# Patient Record
Sex: Male | Born: 1970 | Hispanic: No | Smoking: Former smoker
Health system: Southern US, Community
[De-identification: ages and names within clinical notes are randomized; demographics above are authoritative.]

## PROBLEM LIST (undated history)

## (undated) DIAGNOSIS — IMO0002 Reserved for concepts with insufficient information to code with codable children: Secondary | ICD-10-CM

## (undated) DIAGNOSIS — E785 Hyperlipidemia, unspecified: Secondary | ICD-10-CM

## (undated) DIAGNOSIS — I493 Ventricular premature depolarization: Secondary | ICD-10-CM

## (undated) DIAGNOSIS — I4891 Unspecified atrial fibrillation: Secondary | ICD-10-CM

## (undated) DIAGNOSIS — I499 Cardiac arrhythmia, unspecified: Secondary | ICD-10-CM

## (undated) HISTORY — DX: Ventricular premature depolarization: I49.3

## (undated) HISTORY — DX: Hyperlipidemia, unspecified: E78.5

## (undated) HISTORY — DX: Reserved for concepts with insufficient information to code with codable children: IMO0002

## (undated) HISTORY — PX: OTHER SURGICAL HISTORY: SHX169

## (undated) HISTORY — DX: Unspecified atrial fibrillation: I48.91

---

## 2010-04-26 ENCOUNTER — Ambulatory Visit: Payer: Self-pay | Admitting: Cardiovascular Disease

## 2011-10-02 ENCOUNTER — Encounter: Payer: Self-pay | Admitting: *Deleted

## 2012-03-26 ENCOUNTER — Ambulatory Visit (INDEPENDENT_AMBULATORY_CARE_PROVIDER_SITE_OTHER): Payer: 59 | Admitting: Internal Medicine

## 2012-03-26 ENCOUNTER — Encounter: Payer: Self-pay | Admitting: Internal Medicine

## 2012-03-26 VITALS — BP 114/80 | HR 79 | Temp 98.3°F | Ht 72.0 in | Wt 200.4 lb

## 2012-03-26 DIAGNOSIS — R0789 Other chest pain: Secondary | ICD-10-CM

## 2012-03-26 DIAGNOSIS — R05 Cough: Secondary | ICD-10-CM

## 2012-03-26 DIAGNOSIS — R059 Cough, unspecified: Secondary | ICD-10-CM

## 2012-03-26 NOTE — Patient Instructions (Addendum)
Classic subdiaphragmatic pain pattern suggests ibs:  Stereotypical, migratory with a very limited distribution of pain locations, daytime, not exacerbated by ex or coughing, worse in sitting position, associated with generalized abd bloating, not present supine due to the dome effect of the diaphragm is  canceled in that position. Frequently these patients have had multiple negative GI workups and CT scans and cardiology.  Treatment consists of avoiding foods that cause gas (especially beans and raw vegetables like spinach and salads)  and citrucel 1 heaping tsp twice daily with a large glass of water.  Pain should improve w/in 2 weeks and if not return to clinic  Zantac 150 x 2 after breakfast and after supper.   GERD (REFLUX)  is an extremely common cause of respiratory symptoms, many times with no significant heartburn at all.    It can be treated with medication, but also with lifestyle changes including avoidance of late meals, excessive alcohol, smoking cessation, and avoid fatty foods, chocolate, peppermint, colas, red wine, and acidic juices such as orange juice.  NO MINT OR MENTHOL PRODUCTS SO NO COUGH DROPS  USE SUGARLESS CANDY INSTEAD (jolley ranchers or Stover's)  NO OIL BASED VITAMINS - use powdered substitutes.   For back discomfort you can try zostrix cream applied 4 x daily especially at bedtime.   Late add  Chart review shows no xrays in system > stronlgy rec he return for f/u and cxr one month

## 2012-03-26 NOTE — Progress Notes (Signed)
  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  03/26/2012 1st pulmonary eval cc indolent onset persistent daily cp x sev years back > front not pleuritic pressure sensation seems better p eat, related p pass gas more up than down . Presently no sob. Stress makes it worse. Positional better supine, not pleuritic.  Cough is daily, dry, taking zantac as needed, never prilosec. No overt hb or sinu complaints or seasonal variation, and really dates back to smoking (never got completely better when quit)  Area of post cw discomfort hyperesthetic to touch.  Sleeping ok without nocturnal  or early am exacerbation  of respiratory  c/o's or need for noct saba. Also denies any obvious fluctuation of symptoms with weather or environmental changes or other aggravating or alleviating factors except as outlined above   Review of Systems  Constitutional: Negative for fever, chills, activity change, appetite change and unexpected weight change.  HENT: Negative for congestion, sore throat, rhinorrhea, sneezing, trouble swallowing, dental problem, voice change and postnasal drip.   Eyes: Negative for visual disturbance.  Respiratory: Positive for cough and shortness of breath. Negative for choking.   Cardiovascular: Negative for chest pain and leg swelling.  Gastrointestinal: Negative for nausea, vomiting and abdominal pain.  Genitourinary: Negative for difficulty urinating.  Musculoskeletal: Negative for arthralgias.  Skin: Negative for rash.  Psychiatric/Behavioral: Negative for behavioral problems and confusion.       Objective:   Physical Exam  Wt 200 lb 03/26/2012   In general amb pleasant wm nad ' HEENT:  nl dentition, turbinates, and orophanx. Nl external ear canals without cough reflex   NECK :  without JVD/Nodes/TM/ nl carotid upstrokes bilaterally   LUNGS: no acc muscle use, clear to A and P bilaterally without cough on insp or exp maneuvers   CV:  RRR  no s3 or murmur or increase in P2, no edema   ABD:  soft and nontender with nl excursion in the supine position. No bruits or organomegaly, bowel sounds nl  MS:  warm without deformities, calf tenderness, cyanosis or clubbing  SKIN: warm and dry without lesions    NEURO:  alert, approp, no deficits       Assessment & Plan:

## 2012-03-27 ENCOUNTER — Institutional Professional Consult (permissible substitution): Payer: Self-pay | Admitting: Internal Medicine

## 2012-03-28 ENCOUNTER — Encounter: Payer: Self-pay | Admitting: Cardiovascular Disease

## 2012-03-28 ENCOUNTER — Ambulatory Visit (INDEPENDENT_AMBULATORY_CARE_PROVIDER_SITE_OTHER): Payer: 59 | Admitting: Cardiovascular Disease

## 2012-03-28 VITALS — BP 132/80 | Ht 72.0 in | Wt 203.0 lb

## 2012-03-28 DIAGNOSIS — R0789 Other chest pain: Secondary | ICD-10-CM

## 2012-03-28 DIAGNOSIS — R9431 Abnormal electrocardiogram [ECG] [EKG]: Secondary | ICD-10-CM

## 2012-03-28 NOTE — Progress Notes (Signed)
    Edwin Whitaker Date of Birth  April 24, 1971       [facility]    [facility] [address], [address]  [address], [address] [place], [place]  [address]   [place], [place]  [address] [phone]     [phone]   Fax  [phone]    Fax [phone]  Problem List: 1.  Chest pain 2. Hyperlipidemia  History of Present Illness:  Edwin Whitaker is 41 yo with hx of chest pain.  He has had a negative stress.  He has also had some palpitations and was given propranolol.   Remote hx of smoking.  He has had lots of chest discomfort recently.  He has had some chest pain  - radiating to his back.  It is exacerbated by mental  stress .  Walking / exercising also seems to exacerbate it.These episodes are episodic.    He still has palpitations.    He has seen GI - nagative endoscopy.  Saw Dr. Sherene Sires - Dr. Sherene Sires thinks this is gas pain.  He has been a good diet of citrocil and zantac and feels a bit better.    Current Outpatient Prescriptions on File Prior to Visit  Medication Sig Dispense Refill  . aspirin 81 MG tablet Take 81 mg by mouth daily.      Marland Kitchen atorvastatin (LIPITOR) 20 MG tablet Take 20 mg by mouth daily.      . propranolol (INDERAL) 10 MG tablet Take 10 mg by mouth as needed.      . valACYclovir (VALTREX) 1000 MG tablet Take 1,000 mg by mouth daily.        No Known Allergies  Past Medical History  Diagnosis Date  . HLD (hyperlipidemia)   . Transient atrial fibrillation or flutter   . PVC's (premature ventricular contractions)     Past Surgical History  Procedure Date  . None     History  Smoking status  . Former Smoker -- 1.5 packs/day for 23 years  . Types: Cigarettes  . Quit date: 10/08/2009  Smokeless tobacco  . Never Used    History  Alcohol Use  . Yes    Comment: 10 drinks/month    Family History  Problem Relation Age of Onset  . Allergies Father   . Breast cancer Sister     Reviw of Systems:  Reviewed in the HPI.  All other  systems are negative.  Physical Exam: Blood pressure 132/80, height 6' (1.829 m), weight 203 lb (92.08 kg), SpO2 95.00%. General: Well developed, well nourished, in no acute distress.  Head: Normocephalic, atraumatic, sclera non-icteric, mucus membranes are moist,   Neck: Supple. Carotids are 2 + without bruits. No JVD  Lungs: Clear bilaterally to auscultation.  Heart: regular rate.  normal  S1 S2. No murmurs, gallops or rubs.  Abdomen: Soft, non-tender, non-distended with normal bowel sounds. No hepatomegaly. No rebound/guarding. No masses.  Msk:  Strength and tone are normal  Extremities: No clubbing or cyanosis. No edema.  Distal pedal pulses are 2+ and equal bilaterally.  Neuro: Alert and oriented X 3. Moves all extremities spontaneously.  Psych:  Responds to questions appropriately with a normal affect.  ECG: 03/28/2012-normal sinus rhythm at 83. He has no ST or T wave changes. A small inferior Q waves. These Q waves are slightly bigger than they were several years ago.  Assessment / Plan:

## 2012-03-28 NOTE — Assessment & Plan Note (Addendum)
Edwin Whitaker presents with some episodes of chest discomfort. These are somewhat atypical but he does state that occasionally will worsen with exercise. It is described as a tightness or an aching sensation.  He has a very tiny Q waves on his resting EKG. These are little bit more prominent now compared to his previous EKG several years ago.  I would like to perform a stress echocardiogram. This should give [place] a good look at his wall motion. We should be able to easily rule out a previous inferior wall myocardial infarction with the stress echo.  I will see him again in one year. I'll see him sooner if he has any abnormalities seen on stress echo.

## 2012-03-28 NOTE — Patient Instructions (Signed)
Your physician has requested that you have a stress echocardiogram.  Please follow instruction sheet as given.  Your physician wants you to follow-up in: 1 year  You will receive a reminder letter in the mail two months in advance. If you don't receive a letter, please call our office to schedule the follow-up appointment.  Your physician recommends that you continue on your current medications as directed. Please refer to the Current Medication list given to you today.

## 2012-04-03 ENCOUNTER — Encounter: Payer: Self-pay | Admitting: Internal Medicine

## 2012-04-03 DIAGNOSIS — R05 Cough: Secondary | ICD-10-CM | POA: Insufficient documentation

## 2012-04-03 NOTE — Assessment & Plan Note (Signed)
Classic subdiaphragmatic pain pattern suggests ibs:  Stereotypical, migratory with a very limited distribution of pain locations, daytime, not exacerbated by ex or coughing, worse in sitting position, associated with generalized abd bloating, not present supine due to the dome effect of the diaphragm is  canceled in that position. Frequently these patients have had multiple negative cards and GI workups and CT scans.  Treatment consists of avoiding foods that cause gas (especially beans and raw vegetables like spinach and salads)  and citrucel 1 heaping tsp twice daily with a large glass of water.  Pain should improve w/in 2 weeks and if not then consider further GI work up.    Also could be partly gerd related > rec max rx  Some of this post cp could represent neuralgia since assoc with hyperasthesia > try zostrix

## 2012-04-03 NOTE — Assessment & Plan Note (Signed)
Classic Upper airway cough syndrome, so named because it's frequently impossible to sort out how much is  CR/sinusitis with freq throat clearing (which can be related to primary GERD)   vs  causing  secondary (" extra esophageal")  GERD from wide swings in gastric pressure that occur with throat clearing, often  promoting self use of mint and menthol lozenges that reduce the lower esophageal sphincter tone and exacerbate the problem further in a cyclical fashion.   These are the same pts (now being labeled as having "irritable larynx syndrome" by some cough centers) who not infrequently have a history of having failed to tolerate ace inhibitors,  dry powder inhalers or biphosphonates or report having atypical reflux symptoms that don't respond to standard doses of PPI , and are easily confused as having aecopd or asthma flares by even experienced allergists/ pulmonologists.  For now max gerd rx then regroup in 4-6 weeks

## 2012-04-07 ENCOUNTER — Telehealth: Payer: Self-pay | Admitting: *Deleted

## 2012-04-07 ENCOUNTER — Encounter: Payer: Self-pay | Admitting: Cardiovascular Disease

## 2012-04-07 NOTE — Telephone Encounter (Signed)
Message copied by Christen Butter on Mon Apr 07, 2012 11:30 AM ------      Message from: Sandrea Hughs B      Created: Thu Apr 03, 2012  3:21 PM       Needs f/u ov with cxr in 4 weeks (don't see one in system but ok to bring one / get report if had it in last 3-6 months

## 2012-04-09 NOTE — Telephone Encounter (Signed)
PFT and rov with MW scheduled for 05/13/12 at 9 and 10 am

## 2012-04-25 ENCOUNTER — Ambulatory Visit (HOSPITAL_BASED_OUTPATIENT_CLINIC_OR_DEPARTMENT_OTHER): Payer: 59 | Admitting: Radiology

## 2012-04-25 ENCOUNTER — Ambulatory Visit (HOSPITAL_COMMUNITY): Payer: 59 | Attending: Cardiovascular Disease

## 2012-04-25 DIAGNOSIS — Z87891 Personal history of nicotine dependence: Secondary | ICD-10-CM | POA: Insufficient documentation

## 2012-04-25 DIAGNOSIS — R0989 Other specified symptoms and signs involving the circulatory and respiratory systems: Secondary | ICD-10-CM

## 2012-04-25 DIAGNOSIS — I4891 Unspecified atrial fibrillation: Secondary | ICD-10-CM | POA: Insufficient documentation

## 2012-04-25 DIAGNOSIS — R0789 Other chest pain: Secondary | ICD-10-CM

## 2012-04-25 DIAGNOSIS — I4892 Unspecified atrial flutter: Secondary | ICD-10-CM | POA: Insufficient documentation

## 2012-04-25 DIAGNOSIS — R002 Palpitations: Secondary | ICD-10-CM | POA: Insufficient documentation

## 2012-04-25 DIAGNOSIS — R9431 Abnormal electrocardiogram [ECG] [EKG]: Secondary | ICD-10-CM

## 2012-04-25 DIAGNOSIS — R072 Precordial pain: Secondary | ICD-10-CM | POA: Insufficient documentation

## 2012-04-25 NOTE — Progress Notes (Signed)
Echocardiogram performed.  

## 2012-05-13 ENCOUNTER — Ambulatory Visit: Payer: 59 | Admitting: Internal Medicine

## 2012-06-30 ENCOUNTER — Encounter: Payer: Self-pay | Admitting: Internal Medicine

## 2012-06-30 ENCOUNTER — Ambulatory Visit (INDEPENDENT_AMBULATORY_CARE_PROVIDER_SITE_OTHER): Payer: 59 | Admitting: Internal Medicine

## 2012-06-30 VITALS — BP 120/92 | HR 58 | Temp 98.5°F | Ht 72.0 in | Wt 210.0 lb

## 2012-06-30 DIAGNOSIS — R05 Cough: Secondary | ICD-10-CM

## 2012-06-30 DIAGNOSIS — R0789 Other chest pain: Secondary | ICD-10-CM

## 2012-06-30 LAB — PULMONARY FUNCTION TEST

## 2012-06-30 NOTE — Progress Notes (Signed)
PFT done today. 

## 2012-06-30 NOTE — Patient Instructions (Addendum)
Continue citrucel indefinitely.  You do not have any kind of lung injury and never will unless you are exposed again heavily to cigarette or other environmental smoke   If you are satisfied with your treatment plan let your doctor know and he/she can either refill your medications or you can return here when your prescription runs out.     If in any way you are not 100% satisfied,  please tell us.  If 100% better, tell your friends!

## 2012-06-30 NOTE — Assessment & Plan Note (Addendum)
-   PFT's 06/30/2012 WNL  Controlled with rx for gerd including diet and bid h2 so this is likely upper airway cough syndrome.   Classic Upper airway cough syndrome, so named because it's frequently impossible to sort out how much is  CR/sinusitis with freq throat clearing (which can be related to primary GERD)   vs  causing  secondary (" extra esophageal")  GERD from wide swings in gastric pressure that occur with throat clearing, often  promoting self use of mint and menthol lozenges that reduce the lower esophageal sphincter tone and exacerbate the problem further in a cyclical fashion.   These are the same pts (now being labeled as having "irritable larynx syndrome" by some cough centers) who not infrequently have a history of having failed to tolerate ace inhibitors,  dry powder inhalers or biphosphonates or report having atypical reflux symptoms that don't respond to standard doses of PPI , and are easily confused as having aecopd or asthma flares by even experienced allergists/ pulmonologists.   He is so much better no need for further w/u.

## 2012-06-30 NOTE — Progress Notes (Addendum)
  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  03/26/2012 1st pulmonary eval cc indolent onset persistent daily cp x sev years back > front not pleuritic pressure sensation seems better p eat, related p pass gas more up than down . Presently no sob. Stress makes it worse. Positional better supine, not pleuritic.  Cough is daily, dry, taking zantac as needed, never prilosec. No overt hb or sinus complaints or seasonal variation, and really dates back to smoking (never got completely better when quit) Area of post cw discomfort hyperesthetic to touch rec Classic subdiaphragmatic pain pattern suggests ibs Treatment consists of avoiding foods that cause gas (especially beans and raw vegetables like spinach and salads)  and citrucel 1 heaping tsp twice daily with a large glass of water.  Pain should improve w/in 2 weeks and if not return to clinic Zantac 150 x 2 after breakfast and after supper.  GERD diet    06/30/2012 f/u ov/Edwin Whitaker cc much better p rx with citrucel, no more cp and only sob with heavy exertion.   No obvious daytime variabilty or assoc chronic cough or cp or chest tightness, subjective wheeze overt sinus or hb symptoms. No unusual exp hx or h/o childhood pna/ asthma or premature birth to his knowledge.   Sleeping ok without nocturnal  or early am exacerbation  of respiratory  c/o's or need for noct saba. Also denies any obvious fluctuation of symptoms with weather or environmental changes or other aggravating or alleviating factors except as outlined above   ROS  The following are not active complaints unless bolded sore throat, dysphagia, dental problems, itching,  sneezing,  nasal congestion or excess/ purulent secretions, ear ache,   fever, chills, sweats, unintended wt loss, pleuritic or exertional cp, hemoptysis,  orthopnea pnd or leg swelling, presyncope, palpitations, heartburn, abdominal pain, anorexia, nausea, vomiting, diarrhea  or change in bowel or urinary habits, change in stools or urine, dysuria,hematuria,  rash, arthralgias, visual complaints, headache, numbness weakness or ataxia or problems with walking or coordination,  change in mood/affect or memory.            Objective:   Physical Exam  Wt 200 lb 03/26/2012 > 210 06/30/2012   In general amb pleasant wm nad ' HEENT: nl dentition, turbinates, and orophanx. Nl external ear canals without cough reflex   NECK :  without JVD/Nodes/TM/ nl carotid upstrokes bilaterally   LUNGS: no acc muscle use, clear to A and P bilaterally without cough on insp or exp maneuvers   CV:  RRR  no s3 or murmur or increase in P2, no edema   ABD:  soft and nontender with nl excursion in the supine position. No bruits or organomegaly, bowel sounds nl  MS:  warm without deformities, calf tenderness, cyanosis or clubbing  SKIN: warm and dry without lesions    NEURO:  alert, approp, no deficits  cxr's done at [facility] "ok"       Assessment & Plan:

## 2012-06-30 NOTE — Assessment & Plan Note (Signed)
Classic IBS pattern > response to citrucel is convincing > no changes needed, f/u prn

## 2012-07-09 ENCOUNTER — Encounter: Payer: Self-pay | Admitting: Internal Medicine

## 2013-12-07 ENCOUNTER — Other Ambulatory Visit: Payer: Self-pay | Admitting: Cardiology

## 2013-12-07 ENCOUNTER — Telehealth: Payer: Self-pay | Admitting: Cardiovascular Disease

## 2013-12-07 ENCOUNTER — Ambulatory Visit (HOSPITAL_COMMUNITY): Payer: 59 | Admitting: Anesthesiology

## 2013-12-07 ENCOUNTER — Encounter (HOSPITAL_COMMUNITY)
Admission: AD | Disposition: A | Discharge status: Home / Self Care | Payer: 59 | Source: Ambulatory Visit | Attending: Cardiology

## 2013-12-07 ENCOUNTER — Ambulatory Visit (HOSPITAL_COMMUNITY)
Admission: AD | Admit: 2013-12-07 | Discharge: 2013-12-07 | Disposition: A | Discharge status: Home / Self Care | Payer: 59 | Source: Ambulatory Visit | Attending: Cardiology | Admitting: Cardiology

## 2013-12-07 ENCOUNTER — Encounter (HOSPITAL_COMMUNITY): Payer: Self-pay | Admitting: Gastroenterology

## 2013-12-07 ENCOUNTER — Encounter (HOSPITAL_COMMUNITY): Payer: 59 | Admitting: Anesthesiology

## 2013-12-07 ENCOUNTER — Encounter (HOSPITAL_COMMUNITY): Payer: Self-pay

## 2013-12-07 DIAGNOSIS — K219 Gastro-esophageal reflux disease without esophagitis: Secondary | ICD-10-CM | POA: Insufficient documentation

## 2013-12-07 DIAGNOSIS — I4891 Unspecified atrial fibrillation: Secondary | ICD-10-CM | POA: Insufficient documentation

## 2013-12-07 DIAGNOSIS — I4949 Other premature depolarization: Secondary | ICD-10-CM | POA: Insufficient documentation

## 2013-12-07 DIAGNOSIS — Z87891 Personal history of nicotine dependence: Secondary | ICD-10-CM | POA: Insufficient documentation

## 2013-12-07 DIAGNOSIS — I10 Essential (primary) hypertension: Secondary | ICD-10-CM | POA: Insufficient documentation

## 2013-12-07 HISTORY — DX: Cardiac arrhythmia, unspecified: I49.9

## 2013-12-07 HISTORY — DX: Unspecified atrial fibrillation: I48.91

## 2013-12-07 HISTORY — PX: CARDIOVERSION: SHX1299

## 2013-12-07 LAB — POCT I-STAT 4, (NA,K, GLUC, HGB,HCT)
GLUCOSE: 98 mg/dL (ref 70–99)
HEMATOCRIT: 48 % (ref 39.0–52.0)
Hemoglobin: 16.3 g/dL (ref 13.0–17.0)
POTASSIUM: 4.2 meq/L (ref 3.7–5.3)
Sodium: 141 mEq/L (ref 137–147)

## 2013-12-07 SURGERY — CARDIOVERSION
Anesthesia: Monitor Anesthesia Care

## 2013-12-07 MED ORDER — PROPRANOLOL HCL 10 MG PO TABS: 10.0000 mg | ORAL_TABLET | Freq: Three times a day (TID) | ORAL | Status: DC

## 2013-12-07 MED ORDER — METOPROLOL TARTRATE 25 MG PO TABS
25.0000 mg | ORAL_TABLET | Freq: Two times a day (BID) | ORAL | Status: DC
Start: 2013-12-07 — End: 2013-12-07

## 2013-12-07 MED ORDER — SODIUM CHLORIDE 0.9 % IV SOLN
INTRAVENOUS | Status: DC | PRN
  Administered 2013-12-07: 15:00:00 via INTRAVENOUS

## 2013-12-07 MED ORDER — PROPOFOL 10 MG/ML IV BOLUS
INTRAVENOUS | Status: DC | PRN
  Administered 2013-12-07: 100 mg via INTRAVENOUS

## 2013-12-07 MED ORDER — METOPROLOL TARTRATE 1 MG/ML IV SOLN
INTRAVENOUS | Status: AC
  Filled 2013-12-07: qty 5

## 2013-12-07 MED ORDER — LIDOCAINE HCL (CARDIAC) 20 MG/ML IV SOLN
INTRAVENOUS | Status: DC | PRN
  Administered 2013-12-07: 20 mg via INTRAVENOUS

## 2013-12-07 NOTE — H&P (Signed)
  Please see office visit notes for complete details of HPI.  

## 2013-12-07 NOTE — CV Procedure (Signed)
Direct current cardioversion:  Indication symptomatic A. Fibrillation.  Procedure: Using 100 mg of IV Propofol and 20 IV Lidocaine (for reducing venous pain) for achieving deep sedation, synchronized direct current cardioversion performed. Patient was delivered with 100 Joules of electricity X 1 with success to NSR. Patient tolerated the procedure well. No immediate complication noted.

## 2013-12-07 NOTE — Interval H&P Note (Signed)
History and Physical Interval Note:  12/07/2013 3:33 PM  Edwin Whitaker  has presented today for surgery, with the diagnosis of afib  The various methods of treatment have been discussed with the patient and family. After consideration of risks, benefits and other options for treatment, the patient has consented to  Procedure(s): CARDIOVERSION (N/A) as a surgical intervention .  The patient's history has been reviewed, patient examined, no change in status, stable for surgery.  I have reviewed the patient's chart and labs.  Questions were answered to the patient's satisfaction.     [name]

## 2013-12-07 NOTE — Transfer of Care (Signed)
Immediate Anesthesia Transfer of Care Note  Patient: Edwin Whitaker  Procedure(s) Performed: Procedure(s): CARDIOVERSION (N/A)  Patient Location: PACU  Anesthesia Type:MAC  Level of Consciousness: awake, alert  and oriented  Airway & Oxygen Therapy: Patient Spontanous Breathing and Patient connected to nasal cannula oxygen  Post-op Assessment: Report given to PACU RN  Post vital signs: Reviewed and stable  Complications: No apparent anesthesia complications

## 2013-12-07 NOTE — Telephone Encounter (Signed)
New Problem:        Per pt's wife pt has been in Afib for 2.5 days and Hr has been going up to about 150 and has been controlling it with Baita blocker pt feels poorly, not energy, can not focus.

## 2013-12-07 NOTE — Anesthesia Preprocedure Evaluation (Addendum)
Anesthesia Evaluation  Patient identified by MRN, date of birth, ID band Patient awake    Reviewed: Allergy & Precautions, H&P , NPO status , Patient's Chart, lab work & pertinent test results, reviewed documented beta blocker date and time   History of Anesthesia Complications Negative for: history of anesthetic complications  Airway Mallampati: I TM Distance: >3 FB Neck ROM: Full    Dental  (+) Teeth Intact, Dental Advisory Given   Pulmonary former smoker (quit '11),  breath sounds clear to auscultation  Pulmonary exam normal       Cardiovascular hypertension, Pt. on home beta blockers and Pt. on medications + dysrhythmias Atrial Fibrillation Rhythm:Irregular Rate:Tachycardia  '13 Stress ECHO: normal   Neuro/Psych negative neurological ROS     GI/Hepatic Neg liver ROS, GERD-  Medicated and Controlled,  Endo/Other  negative endocrine ROS  Renal/GU negative Renal ROS     Musculoskeletal   Abdominal   Peds  Hematology   Anesthesia Other Findings   Reproductive/Obstetrics                        Anesthesia Physical Anesthesia Plan  ASA: III  Anesthesia Plan: General   Post-op Pain Management:    Induction: Intravenous  Airway Management Planned: Mask  Additional Equipment:   Intra-op Plan:   Post-operative Plan:   Informed Consent: I have reviewed the patients History and Physical, chart, labs and discussed the procedure including the risks, benefits and alternatives for the proposed anesthesia with the patient or authorized representative who has indicated his/her understanding and acceptance.   Dental advisory given  Plan Discussed with: CRNA and Surgeon  Anesthesia Plan Comments: (Plan routine monitors, GA)        Anesthesia Quick Evaluation

## 2013-12-07 NOTE — Discharge Instructions (Signed)
Electrical Cardioversion, Care After °Refer to this sheet in the next few weeks. These instructions provide you with information on caring for yourself after your procedure. Your health care provider may also give you more specific instructions. Your treatment has been planned according to current medical practices, but problems sometimes occur. Call your health care provider if you have any problems or questions after your procedure. °WHAT TO EXPECT AFTER THE PROCEDURE °After your procedure, it is typical to have the following sensations: °· Some redness on the skin where the shocks were delivered. If this is tender, a sunburn lotion or hydrocortisone cream may help. °· Possible return of an abnormal heart rhythm within hours or days after the procedure. °HOME CARE INSTRUCTIONS °· Only take medicine as directed by your health care provider. Be sure you understand how and when to take your medicine. °· Learn how to feel your pulse and check it often. °· Limit your activity for 48 hours after the procedure or as directed. °· Avoid or minimize caffeine and other stimulants as directed. °SEEK MEDICAL CARE IF: °· You feel like your heart is beating too fast or your pulse is not regular. °· You have any questions about your medicines. °· You have bleeding that will not stop. °SEEK IMMEDIATE MEDICAL CARE IF: °· You are dizzy or feel faint. °· It is hard to breathe or you feel short of breath. °· There is a change in discomfort in your chest. °· Your speech is slurred or you have trouble moving an arm or leg on one side of your body. °· You get a serious muscle cramp that does not go away. °· Your fingers or toes turn cold or blue. °MAKE SURE YOU:  °· Understand these instructions.   °· Will watch your condition.   °· Will get help right away if you are not doing well or get worse. °Document Released: 02/25/2013 Document Reviewed: 02/25/2013 °[employer]® Patient Information ©2015 [employer]. This information is not  intended to replace advice given to you by your health care provider. Make sure you discuss any questions you have with your health care provider. ° °

## 2013-12-07 NOTE — Telephone Encounter (Signed)
Called and spoke with patient's wife, [name], who states patient went into afib Saturday night with a heart rate of approximately 180 bpm; pt c/o SOB and fatigue.  Wife states patient took Propanolol 10 mg a couple of times during the night Saturday and also took some during the day yesterday, Sunday.  [name] states that today the patient's heart rate is down to approximately 100 bpm but patient remains in a fib; states that BP is normal.  Per [name] patient has never had DCCV; states he has previously converted back to NSR by taking Xanax and sleeping and has also had Cardizem in the past.  [name] states it has been approximately 10 years since the patient had episode of a fib.   I spoke with Dr. Acie Fredrickson, who is working in the hospital today, who advised that patient take Metoprolol 25 mg BID and Eliquis 5 mg BID.  Dr. Acie Fredrickson advised that patient's wife, [name] is a Designer, jewellery and that if she does not feel comfortable with this plan to call him back.  Dr. Acie Fredrickson would like [name] to call him tomorrow at [facility] office to report how patient is doing.  I spoke with [name] and gave her Dr. Elmarie Shiley advice.  Kim agrees with plan of care and will call [place] office tomorrow to report patient's condition.  I advised her that I am placing Eliquis samples at front desk for her to pick up for patient.  I advised that Rx for Metoprolol sent and verified patient's pharmacy.  I advised Kim to call back sooner with questions or concerns.  Kim verbalized understanding and agreement.

## 2013-12-07 NOTE — Anesthesia Postprocedure Evaluation (Signed)
  Anesthesia Post-op Note  Patient: Edwin Whitaker  Procedure(s) Performed: Procedure(s): CARDIOVERSION (N/A)  Patient Location: PACU  Anesthesia Type:MAC  Level of Consciousness: awake, alert  and oriented  Airway and Oxygen Therapy: Patient Spontanous Breathing and Patient connected to nasal cannula oxygen  Post-op Pain: none  Post-op Assessment: Post-op Vital signs reviewed, Patient's Cardiovascular Status Stable, Respiratory Function Stable, Patent Airway, No signs of Nausea or vomiting and Pain level controlled  Post-op Vital Signs: Reviewed and stable  Last Vitals:  Filed Vitals:   12/07/13 1530  BP: 107/74  Pulse:   Temp:   Resp: 20    Complications: No apparent anesthesia complications

## 2013-12-08 ENCOUNTER — Encounter (HOSPITAL_COMMUNITY): Payer: Self-pay | Admitting: Cardiology

## 2013-12-25 NOTE — Progress Notes (Signed)
Patient ID: Edwin Whitaker, male   DOB: 01/22/1971, 43 y.o.   MRN: [id number] Return samples of eliquis on 8.6.15

## 2013-12-30 ENCOUNTER — Ambulatory Visit (INDEPENDENT_AMBULATORY_CARE_PROVIDER_SITE_OTHER): Payer: 59 | Admitting: Neurology

## 2013-12-30 ENCOUNTER — Telehealth: Payer: Self-pay | Admitting: Neurology

## 2013-12-30 ENCOUNTER — Encounter: Payer: Self-pay | Admitting: Neurology

## 2013-12-30 VITALS — BP 129/82 | HR 101 | Temp 97.2°F | Ht 72.5 in | Wt 213.0 lb

## 2013-12-30 DIAGNOSIS — R0989 Other specified symptoms and signs involving the circulatory and respiratory systems: Secondary | ICD-10-CM

## 2013-12-30 DIAGNOSIS — I4891 Unspecified atrial fibrillation: Secondary | ICD-10-CM

## 2013-12-30 DIAGNOSIS — R0683 Snoring: Secondary | ICD-10-CM

## 2013-12-30 DIAGNOSIS — R0609 Other forms of dyspnea: Secondary | ICD-10-CM

## 2013-12-30 DIAGNOSIS — I480 Paroxysmal atrial fibrillation: Secondary | ICD-10-CM

## 2013-12-30 DIAGNOSIS — G479 Sleep disorder, unspecified: Secondary | ICD-10-CM

## 2013-12-30 NOTE — Patient Instructions (Signed)

## 2013-12-30 NOTE — Progress Notes (Signed)
Subjective:    Patient ID: Edwin Whitaker is a 43 y.o. male.  HPI    Edwin Age, MD, PhD [facility] [address] [place], [place] [address]  Dear Edwin Whitaker,   I saw your patient, Edwin Whitaker, upon your kind request in my neurologic clinic today for initial consultation of his sleep disturbance, in particular, concern for underlying obstructive sleep apnea. The patient is unaccompanied today. As you know, Edwin Whitaker is a 43 year old right-handed gentleman with an underlying medical history of PVCs and paroxysmal atrial fibrillation, who reports palpitations. He had a successful cardioversion on 12/07/13. He snores mildly. He had a PSG in [place], [place] some 7-8 years ago, which per patient was borderline. He has had trouble falling asleep for years and has been taking Xanax 0.5 mg qHS for the past 3-4 years. He has tried OTC PM type medications without success. He travels by air frequently for work. He feels very overworked and has a lot of work stress. He drinks 2 cups of coffee per day and 250 ml of Vodka per night and has done this for 1-2 years. He quit smoking in 2011. He works for a Emergency planning/management officer.   His typical bedtime is reported to be around 10 PM and usual wake time is around 7 AM. Sleep onset typically occurs within 30-40 minutes. He reports feeling adequately rested upon awakening. He wakes up on an average 1 times in the middle of the night and has to go to the bathroom 1 times on a typical night. He denies morning headaches.  He denies excessive daytime somnolence (EDS) and His Epworth Sleepiness Score (ESS) is 0/24 today. He has not fallen asleep while driving. The patient has not been taking a planned nap.  He has been known to snore for the past few years. Snoring is reportedly moderate, and not clearly associated with choking sounds and witnessed apneas. The patient denies a sense of choking or strangling feeling. There is  report of nighttime reflux, and he takes Zantac 300 mg per day. He has no nighttime cough. The patient has not noted any RLS symptoms and is not known to kick while asleep or before falling asleep. There is family history of OSA on CPAP and he has A fib.  He is not a restless sleeper.   He denies cataplexy, sleep paralysis, hypnagogic or hypnopompic hallucinations, or sleep attacks. He does not report any vivid dreams, nightmares, dream enactments, or parasomnias, such as sleep talking or sleep walking.   His Past Medical History Is Significant For: Past Medical History  Diagnosis Date  . HLD (hyperlipidemia)   . Transient atrial fibrillation or flutter   . PVC's (premature ventricular contractions)   . Dysrhythmia     afib  . A-fib 12/07/13    His Past Surgical History Is Significant For: Past Surgical History  Procedure Laterality Date  . None    . Cardioversion N/A 12/07/2013    Procedure: CARDIOVERSION;  Surgeon: [name], MD;  Location: [facility] ENDOSCOPY;  Service: Cardiovascular;  Laterality: N/A;    His Family History Is Significant For: Family History  Problem Relation Whitaker of Onset  . Allergies Father   . Breast cancer Sister     His Social History Is Significant For: History   Social History  . Marital Status: Married    Spouse Name: Edwin Whitaker    Number of Children: 3  . Years of Education: 16   Occupational History  .  quality manager [employer]   Social History Main Topics  . Smoking status: Former Smoker -- 1.50 packs/day for 23 years    Types: Cigarettes    Quit date: 10/08/2009  . Smokeless tobacco: Never Used  . Alcohol Use: Yes     Comment: 244ml per night   . Drug Use: No  . Sexual Activity: None   Other Topics Concern  . None   Social History Narrative   Patient is right handed and resides with wife and children    His Allergies Are:  No Known Allergies:   His Current Medications Are:  Outpatient Encounter Prescriptions as of  12/30/2013  Medication Sig  . ALPRAZolam (XANAX) 0.5 MG tablet Take 0.5 mg by mouth at bedtime.  Marland Kitchen aspirin 81 MG tablet Take 81 mg by mouth daily.  Marland Kitchen atorvastatin (LIPITOR) 20 MG tablet Take 20 mg by mouth daily.  Marland Kitchen ketoconazole (NIZORAL) 2 % cream Apply 1 application topically daily as needed for irritation (for face).  . propranolol (INDERAL) 10 MG tablet Take 1 tablet (10 mg total) by mouth 3 (three) times daily.  . Ranitidine HCl (ZANTAC PO) Take 1 tablet by mouth daily as needed (heatburn).  . valACYclovir (VALTREX) 500 MG tablet Take 500 mg by mouth daily.  :  Review of Systems:  Out of a complete 14 point review of systems, all are reviewed and negative with the exception of these symptoms as listed below:   Review of Systems  All other systems reviewed and are negative.   Objective:  Neurologic Exam  Physical Exam Physical Examination:   Filed Vitals:   12/30/13 0821  BP: 129/82  Pulse: 101  Temp: 97.2 F (36.2 C)    General Examination: The patient is a very pleasant 43 y.o. male in no acute distress. He appears well-developed and well-nourished and adequately groomed.   HEENT: Normocephalic, atraumatic, pupils are equal, round and reactive to light and accommodation. Funduscopic exam is normal with sharp disc margins noted. Extraocular tracking is good without limitation to gaze excursion or nystagmus noted. Normal smooth pursuit is noted. Hearing is grossly intact. Tympanic membranes are clear bilaterally. Face is symmetric with normal facial animation and normal facial sensation. Speech is clear with no dysarthria noted. There is no hypophonia. There is no lip, neck/head, jaw or voice tremor. Neck is supple with full range of passive and active motion. There are no carotid bruits on auscultation. Oropharynx exam reveals: mild mouth dryness, adequate dental hygiene and moderate airway crowding, due to larger uvula, tonsils in place and slightly longer tongue. Mallampati is  class II. Tongue protrudes centrally and palate elevates symmetrically. Tonsils are 1+ to 2+ on the right and 1+ on the left. Neck size is 16 3/8 inches. He has a Mild overbite.   Chest: Clear to auscultation without wheezing, rhonchi or crackles noted.  Heart: S1+S2+0, regular and normal without murmurs, rubs or gallops noted.   Abdomen: Soft, non-tender and non-distended with normal bowel sounds appreciated on auscultation.  Extremities: There is no pitting edema in the distal lower extremities bilaterally. Pedal pulses are intact.  Skin: Warm and dry without trophic changes noted. There are no varicose veins.  Musculoskeletal: exam reveals no obvious joint deformities, tenderness or joint swelling or erythema.   Neurologically:  Mental status: The patient is awake, alert and oriented in all 4 spheres. His immediate and remote memory, attention, language skills and fund of knowledge are appropriate. There is no evidence of aphasia, agnosia, apraxia  or anomia. Speech is clear with normal prosody and enunciation. Thought process is linear. Mood is normal and affect is constricted.  Cranial nerves II - XII are as described above under HEENT exam. In addition: shoulder shrug is normal with equal shoulder height noted. Motor exam: Normal bulk, strength and tone is noted. There is no drift, tremor or rebound. Romberg is negative. Reflexes are 2+ throughout. Babinski: Toes are flexor bilaterally. Fine motor skills and coordination: intact with normal finger taps, normal hand movements, normal rapid alternating patting, normal foot taps and normal foot agility.  Cerebellar testing: No dysmetria or intention tremor on finger to nose testing. Heel to shin is unremarkable bilaterally. There is no truncal or gait ataxia.  Sensory exam: intact to light touch, pinprick, vibration, temperature sense in the upper and lower extremities.  Gait, station and balance: He stands easily. No veering to one side is  noted. No leaning to one side is noted. Posture is Whitaker-appropriate and stance is narrow based. Gait shows normal stride length and normal pace. No problems turning are noted. He turns en bloc. Tandem walk is unremarkable. Intact toe and heel stance is noted.               Assessment and Plan:   In summary, Edwin Whitaker is a very pleasant 43 y.o.-year old male with a history of paroxysmal A. fib and reflux disease, who has had recent cardioversion for another event of A. fib. Given his titer looking airway, his snoring, his sleep initiation problems, as well as his family history of A. fib and OSA, there is concern for underlying OSA. He previously was diagnosed with borderline findings as understand when he had a sleep study several years ago. I do not have those results available for review and they were done in [place]. At this juncture, would recommend that he be retested with an overnight polysomnogram. He is somewhat apprehensive and reluctant to pursue this but is willing to schedule a sleep study and also willing to try CPAP. He is somewhat concerned about his nighttime routine which includes taking Xanax on a night tonight basis for sleep. His physical exam is otherwise nonfocal.  I had a long chat with the patient and about my findings and the diagnosis of OSA, its prognosis and treatment options. We talked about medical treatments, surgical interventions and non-pharmacological approaches. I explained in particular the risks and ramifications of untreated moderate to severe OSA, especially with respect to developing cardiovascular disease down the Road, including congestive heart failure, difficult to treat hypertension, cardiac arrhythmias, or stroke. Even type 2 diabetes has, in part, been linked to untreated OSA. Symptoms of untreated OSA include daytime sleepiness, memory problems, mood irritability and mood disorder such as depression and anxiety, lack of energy, as well as recurrent  headaches, especially morning headaches. We talked about trying to maintain a healthy lifestyle in general, as well as the importance of weight control. I encouraged the patient to eat healthy, exercise daily and keep well hydrated, to keep a scheduled bedtime and wake time routine, to not skip any meals and eat healthy snacks in between meals. I advised the patient not to drive when feeling sleepy. I recommended the following at this time: sleep study with potential positive airway pressure titration.  I explained the sleep test procedure to the patient and also outlined possible surgical and non-surgical treatment options of OSA, including the use of a custom-made dental device (which would require a referral to a  specialist dentist or oral surgeon), upper airway surgical options, such as pillar implants, radiofrequency surgery, tongue base surgery, and UPPP (which would involve a referral to an ENT surgeon). Rarely, jaw surgery such as mandibular advancement may be considered.  I also explained the CPAP treatment option to the patient, who indicated that he would be willing to try CPAP if the need arises. I explained the importance of being compliant with PAP treatment, not only for insurance purposes but primarily to improve His symptoms, and for the patient's long term health benefit, including to reduce His cardiovascular risks. I answered all his questions today and the patient was in agreement. I would like to see him back after the sleep study is completed and encouraged him to call with any interim questions, concerns, problems or updates.   Thank you very much for allowing me to participate in the care of this nice patient. If I can be of any further assistance to you please do not hesitate to call me at [phone].  Sincerely,   Edwin Age, MD, PhD

## 2013-12-31 NOTE — Telephone Encounter (Signed)
Met with patient after sleep consultation appointment.  He feels that due to work schedule, he will not be able to proceed with a sleep evaluation until November.  Will place this order on hold until this time.

## 2014-08-02 ENCOUNTER — Other Ambulatory Visit: Payer: Self-pay | Admitting: Internal Medicine

## 2014-08-02 ENCOUNTER — Ambulatory Visit: Payer: Self-pay

## 2014-08-02 DIAGNOSIS — R059 Cough, unspecified: Secondary | ICD-10-CM

## 2014-08-02 DIAGNOSIS — R05 Cough: Secondary | ICD-10-CM

## 2015-06-16 ENCOUNTER — Ambulatory Visit
Admission: RE | Admit: 2015-06-16 | Discharge: 2015-06-16 | Disposition: A | Discharge status: Home / Self Care | Payer: 59 | Source: Ambulatory Visit | Attending: Internal Medicine | Admitting: Internal Medicine

## 2015-06-16 ENCOUNTER — Other Ambulatory Visit: Payer: Self-pay | Admitting: Internal Medicine

## 2015-06-16 DIAGNOSIS — M545 Low back pain: Secondary | ICD-10-CM

## 2016-01-10 ENCOUNTER — Other Ambulatory Visit: Payer: Self-pay | Admitting: Internal Medicine

## 2016-01-10 DIAGNOSIS — R1030 Lower abdominal pain, unspecified: Secondary | ICD-10-CM

## 2016-01-11 ENCOUNTER — Ambulatory Visit
Admission: RE | Admit: 2016-01-11 | Discharge: 2016-01-11 | Disposition: A | Discharge status: Home / Self Care | Payer: 59 | Source: Ambulatory Visit | Attending: Internal Medicine | Admitting: Internal Medicine

## 2016-01-11 DIAGNOSIS — R1030 Lower abdominal pain, unspecified: Secondary | ICD-10-CM

## 2016-01-11 MED ORDER — IOPAMIDOL (ISOVUE-300) INJECTION 61%
125.0000 mL | Freq: Once | INTRAVENOUS | Status: AC | PRN
  Administered 2016-01-11: 125 mL via INTRAVENOUS

## 2016-01-13 ENCOUNTER — Other Ambulatory Visit (HOSPITAL_COMMUNITY)
Admission: RE | Admit: 2016-01-13 | Discharge: 2016-01-13 | Disposition: A | Discharge status: Home / Self Care | Payer: 59 | Source: Ambulatory Visit | Attending: Internal Medicine | Admitting: Internal Medicine

## 2016-01-13 DIAGNOSIS — Z029 Encounter for administrative examinations, unspecified: Secondary | ICD-10-CM | POA: Insufficient documentation

## 2016-01-18 LAB — CULTURE, BLOOD (ROUTINE X 2)
Culture: NO GROWTH
Culture: NO GROWTH

## 2019-12-30 ENCOUNTER — Ambulatory Visit: Payer: 59 | Admitting: Physician Assistant

## 2019-12-30 ENCOUNTER — Other Ambulatory Visit: Payer: Self-pay

## 2019-12-30 ENCOUNTER — Encounter: Payer: Self-pay | Admitting: Physician Assistant

## 2019-12-30 DIAGNOSIS — L219 Seborrheic dermatitis, unspecified: Secondary | ICD-10-CM

## 2019-12-30 MED ORDER — CICLOPIROX 1 % EX SHAM
1.0000 | MEDICATED_SHAMPOO | Freq: Every day | CUTANEOUS | 5 refills | Status: DC
Start: 2019-12-30 — End: 2020-06-27

## 2019-12-30 MED ORDER — CLOBETASOL PROPIONATE 0.05 % EX SOLN: 1.0000 "application " | Freq: Two times a day (BID) | CUTANEOUS | 2 refills | Status: DC

## 2019-12-30 NOTE — Progress Notes (Signed)
   Follow up Visit  Subjective  Edwin Whitaker is a 49 y.o. male who presents for the following: Skin Problem (scalp and face check). He has not been treating as often with any of the medicines. He has been using the ciclopirox cream and hydrocortisone cream on central face. He has found it difficult to shampoo with the medicated shampoo and regular shampoo.He is using the cream on the face as needed. Usually about once-twice a week. His scalp is still scaling and itching.   Objective  Well appearing patient in no apparent distress; mood and affect are within normal limits.  A focused examination was performed including face and scalp. Relevant physical exam findings are noted in the Assessment and Plan.   Objective  Left Alar Crease, Right Ala Nasi, Scalp: Erythematous plaques with greasy scale.   Assessment & Plan  Seborrheic dermatitis (3) Left Alar Crease; Right Ala Nasi; Scalp  Ordered Medications: Ciclopirox 1 % shampoo clobetasol (TEMOVATE) 0.05 % external solution

## 2020-06-27 ENCOUNTER — Ambulatory Visit: Payer: 59 | Admitting: Dermatology

## 2020-06-27 ENCOUNTER — Ambulatory Visit: Payer: 59 | Admitting: Physician Assistant

## 2020-06-27 ENCOUNTER — Encounter: Payer: Self-pay | Admitting: Dermatology

## 2020-06-27 ENCOUNTER — Other Ambulatory Visit: Payer: Self-pay

## 2020-06-27 DIAGNOSIS — L219 Seborrheic dermatitis, unspecified: Secondary | ICD-10-CM | POA: Diagnosis not present

## 2020-06-27 DIAGNOSIS — L918 Other hypertrophic disorders of the skin: Secondary | ICD-10-CM

## 2020-06-27 DIAGNOSIS — Z1283 Encounter for screening for malignant neoplasm of skin: Secondary | ICD-10-CM | POA: Diagnosis not present

## 2020-06-27 MED ORDER — TACROLIMUS 0.1 % EX OINT: TOPICAL_OINTMENT | Freq: Every day | CUTANEOUS | 3 refills | Status: DC

## 2020-06-27 MED ORDER — CLOBETASOL PROPIONATE 0.05 % EX SOLN: 1.0000 "application " | Freq: Two times a day (BID) | CUTANEOUS | 11 refills | Status: DC

## 2020-07-09 ENCOUNTER — Encounter: Payer: Self-pay | Admitting: Dermatology

## 2020-07-09 NOTE — Progress Notes (Signed)
   Follow-Up Visit   Subjective  Edwin Whitaker is a 50 y.o. male who presents for the following: Follow-up (Face- some better - comes and goes tx ketaconazole cream, ciclopirox shampoo).  Rash Location: Face Duration:  Quality:  Associated Signs/Symptoms: Modifying Factors: Ketoconazole cream Severity:  Timing: Context:   Objective  Well appearing patient in no apparent distress; mood and affect are within normal limits. Objective  Left Alar Crease, Right Ala Nasi, Scalp: Erythema with mild to moderate scale.  Chronic nature of this, problem detailed along with possibility that Covid mask is an exacerbating factor.  Objective  Waist Up: Waist up exam today, no signs of atypical moles, melanoma or non mole skin cancer.  Objective  Dorsal Penile Shaft: At the conclusion of the visit Edwin Whitaker asked me about scheduling to have a skin tag removed from the shaft of the penis; he chose to not have me examine this area today.  I informed him that there are 3 common benign growths in this location: Skin tags, keratoses, and warts.    Any of these if they are small are easily removed and he may choose to schedule the minor procedure.    All skin waist up examined.   Assessment & Plan    Seborrheic dermatitis (3) Left Alar Crease; Right Ala Nasi; Scalp  tacrolimus (PROTOPIC) 0.1 % ointment - Left Alar Crease, Right Ala Nasi  clobetasol (TEMOVATE) 0.05 % external solution - Scalp  Skin exam for malignant neoplasm Waist Up  Yearly skin check, encouraged to self examine twice annually.  Continued ultraviolet protection.  Skin tag Dorsal Penile Shaft  Schedule minor procedure.      I, Lavonna Monarch, MD, have reviewed all documentation for this visit.  The documentation on 07/09/20 for the exam, diagnosis, procedures, and orders are all accurate and complete.

## 2020-12-08 ENCOUNTER — Other Ambulatory Visit: Payer: Self-pay | Admitting: Internal Medicine

## 2020-12-08 DIAGNOSIS — E785 Hyperlipidemia, unspecified: Secondary | ICD-10-CM

## 2020-12-27 ENCOUNTER — Other Ambulatory Visit: Payer: 59

## 2021-01-16 ENCOUNTER — Ambulatory Visit
Admission: RE | Admit: 2021-01-16 | Discharge: 2021-01-16 | Disposition: A | Discharge status: Home / Self Care | Payer: No Typology Code available for payment source | Source: Ambulatory Visit | Attending: Internal Medicine | Admitting: Internal Medicine

## 2021-01-16 DIAGNOSIS — E785 Hyperlipidemia, unspecified: Secondary | ICD-10-CM

## 2021-07-03 ENCOUNTER — Other Ambulatory Visit: Payer: Self-pay

## 2021-07-03 ENCOUNTER — Encounter: Payer: Self-pay | Admitting: Dermatology

## 2021-07-03 ENCOUNTER — Ambulatory Visit: Payer: 59 | Admitting: Dermatology

## 2021-07-03 DIAGNOSIS — Z1283 Encounter for screening for malignant neoplasm of skin: Secondary | ICD-10-CM | POA: Diagnosis not present

## 2021-07-03 DIAGNOSIS — K1379 Other lesions of oral mucosa: Secondary | ICD-10-CM | POA: Diagnosis not present

## 2021-07-03 DIAGNOSIS — D1801 Hemangioma of skin and subcutaneous tissue: Secondary | ICD-10-CM

## 2021-07-03 DIAGNOSIS — L219 Seborrheic dermatitis, unspecified: Secondary | ICD-10-CM | POA: Diagnosis not present

## 2021-07-03 MED ORDER — CLOBETASOL PROPIONATE 0.05 % EX SOLN: 1.0000 "application " | Freq: Two times a day (BID) | CUTANEOUS | 11 refills | Status: AC

## 2021-07-03 MED ORDER — TACROLIMUS 0.1 % EX OINT
TOPICAL_OINTMENT | Freq: Every day | CUTANEOUS | 3 refills | Status: AC
Start: 2021-07-03 — End: ?

## 2021-07-04 ENCOUNTER — Encounter: Payer: Self-pay | Admitting: Dermatology

## 2021-07-04 NOTE — Progress Notes (Signed)
° °  Follow-Up Visit   Subjective  Edwin Whitaker is a 51 y.o. male who presents for the following: Medication Refill (Irration on face. Pt states has build up on the scalp needs refill on clobetasol scalp solution- has worked for him in past.).  Needs refills for his seborrheic dermatitis and general skin check Location:  Duration:  Quality:  Associated Signs/Symptoms: Modifying Factors:  Severity:  Timing: Context:   Objective  Well appearing patient in no apparent distress; mood and affect are within normal limits. Upper Body Upper body exam: No atypical pigmented lesions or nonmelanoma skin cancer.  Few 1 mm smooth red dermal papules  Scalp Moderate erythremia and edema plus minor scale essential face, minimal involvement on scalp  Mid Lower Vermilion Lip 5 mm monochrome brown macule lower lip plaque, no dermoscopic atypia.    All skin waist up examined.   Assessment & Plan    Screening exam for skin cancer Upper Body  Yrly annual skin examination, encouraged to self examine with spouse twice annually, continue ultraviolet protection.  Seborrheic dermatitis Scalp  Patient has excellent understanding of proper techniques to control this chronic condition.  He uses over-the-counter hydrocortisone on his face for just 1 or 2 days when he experiences flares.  He never uses the scalp product on other areas.  tacrolimus (PROTOPIC) 0.1 % ointment - Scalp Apply topically at bedtime.  clobetasol (TEMOVATE) 0.05 % external solution - Scalp Apply 1 application topically 2 (two) times daily.  Melanotic macule of oral mucosa, reactive type Mid Lower Vermilion Lip  Recheck as needed change  Cherry angioma  No intervention indicated      I, Lavonna Monarch, MD, have reviewed all documentation for this visit.  The documentation on 07/04/21 for the exam, diagnosis, procedures, and orders are all accurate and complete.

## 2022-07-03 ENCOUNTER — Ambulatory Visit: Payer: 59 | Admitting: Dermatology

## 2022-11-04 IMAGING — CT CT CARDIAC CORONARY ARTERY CALCIUM SCORE
3 series · 13 of 20 positions shown, 15 images · non-contrast
Comparison: None.

CLINICAL DATA: Hyperlipidemia

EXAM:
CT CARDIAC CORONARY ARTERY CALCIUM SCORE
TECHNIQUE: Non-contrast imaging through the heart was performed using
prospective ECG gating. Image post processing was performed on an
independent workstation, allowing for quantitative analysis of the
heart and coronary arteries. Note that this exam targets the heart
and the chest was not imaged in its entirety.

[Series 2: calcium scoring 2.00 qr36 bestdiast 69% hrt calciu · axial · 0.32mm/px · z∈[+1722,+1782]mm · 3 of 76 slices shown]
[im 16/76  vessel]
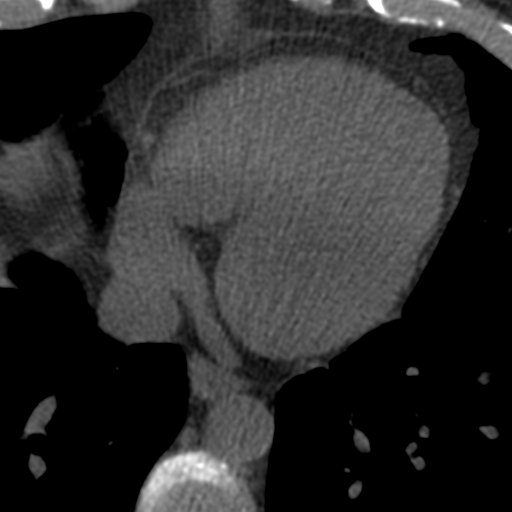
[im 31/76  vessel]
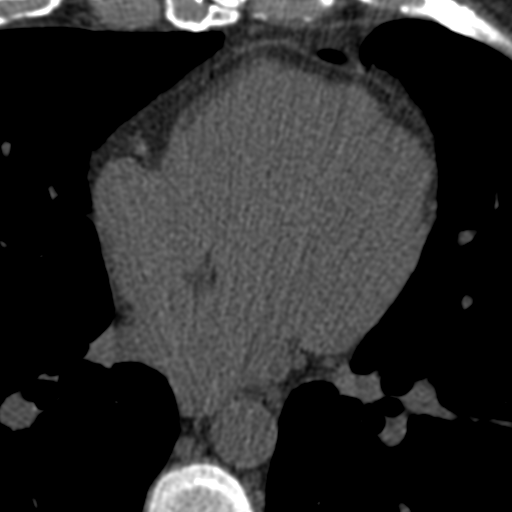
[im 46/76  vessel]
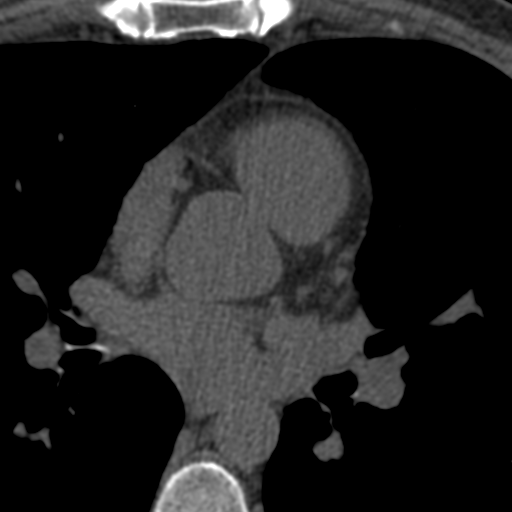

[Series 3: calcium scoring 2.00 br40 bestdiast 69% axial · axial · 0.51mm/px · z∈[+1716,+1816]mm · 5 of 76 slices shown, 7 images]
[im 13/76  vessel]
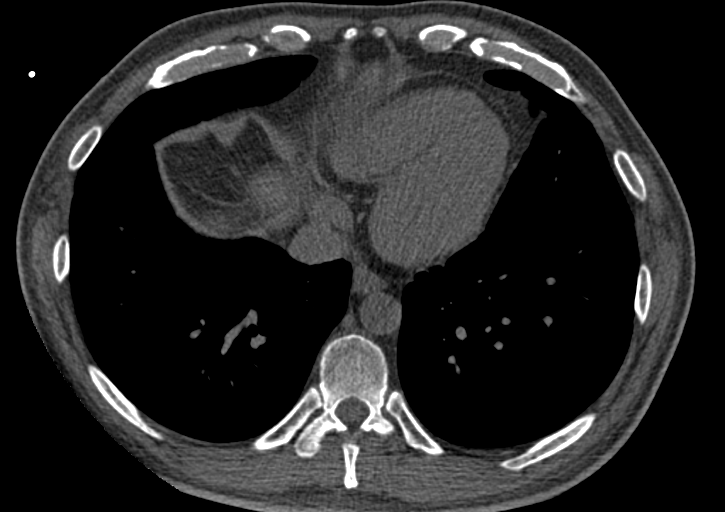
[im 13/76  lung]
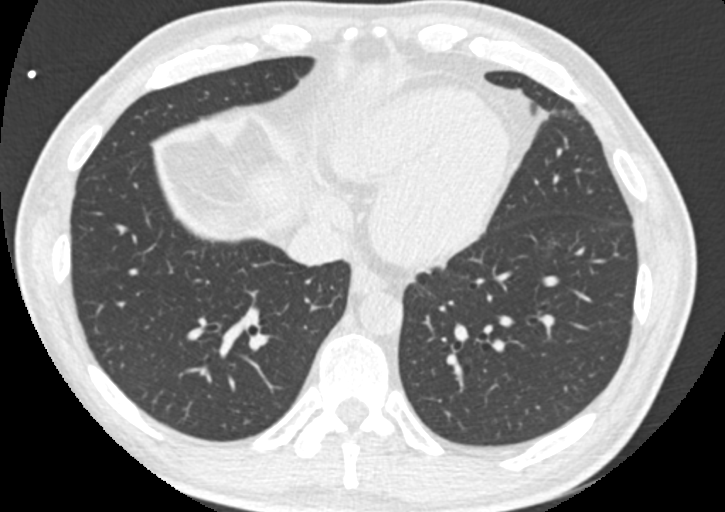
[im 26/76  vessel]
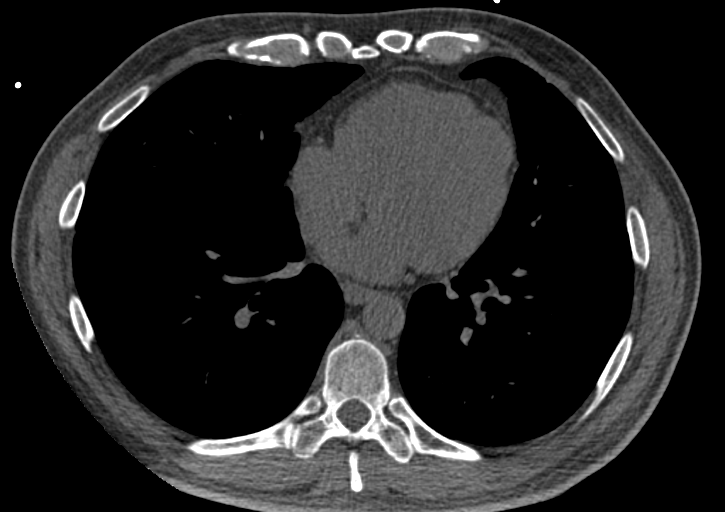
[im 38/76  vessel]
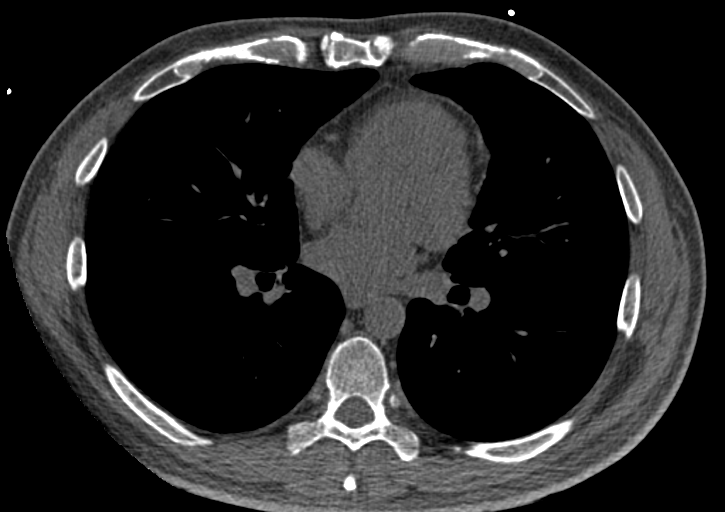
[im 51/76  vessel]
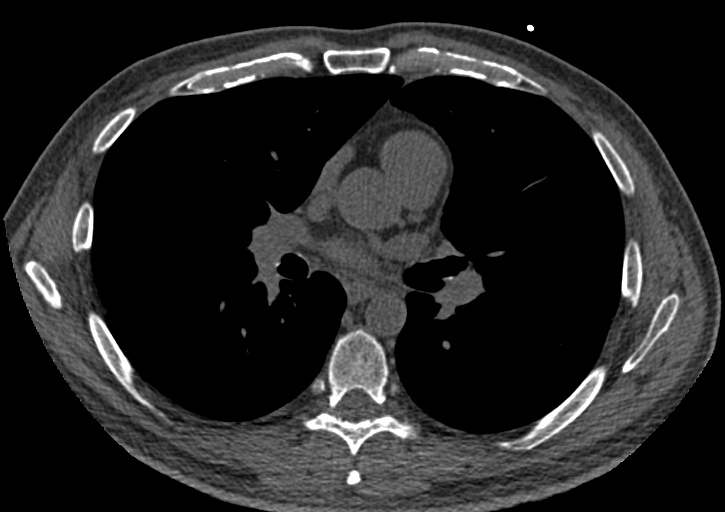
[im 63/76  vessel]
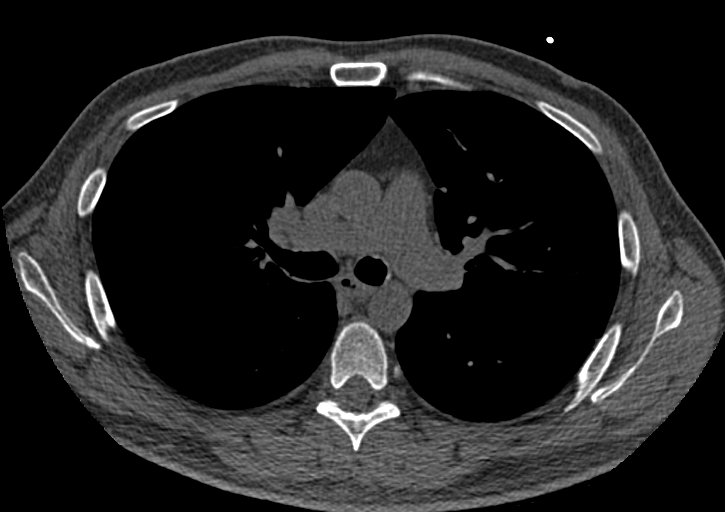
[im 63/76  lung]
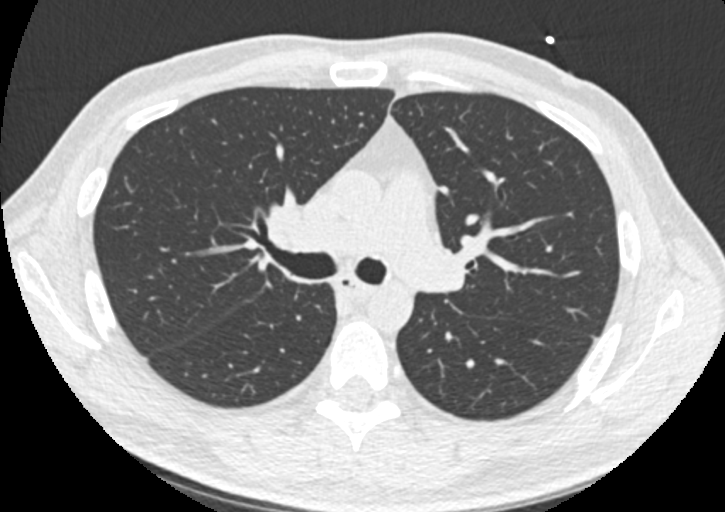

[Series 9: calcium scoring 2.00 br60 bestdiast 69% lungs · axial · 0.51mm/px · z∈[+1716,+1816]mm · 5 of 76 slices shown]
[im 13/76  vessel]
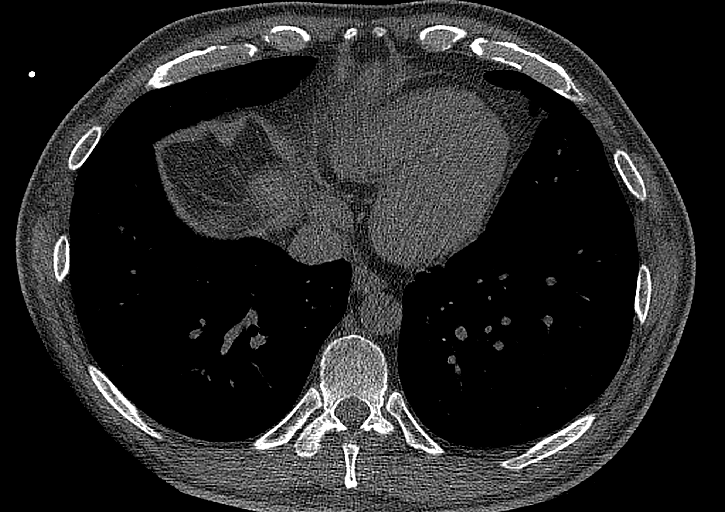
[im 26/76  vessel]
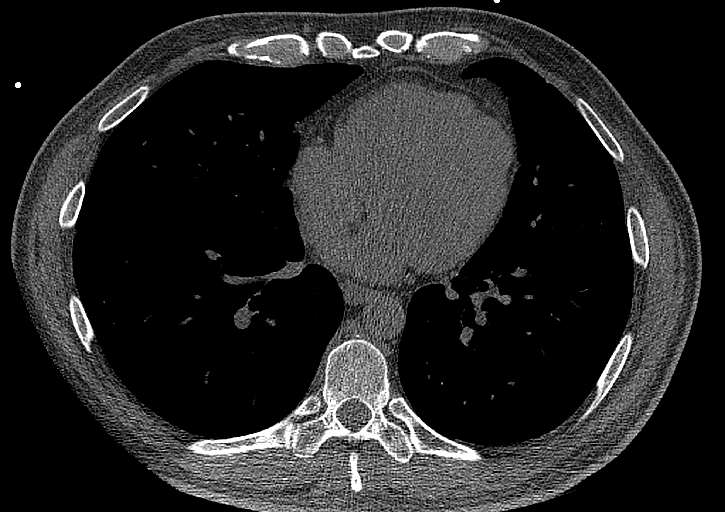
[im 38/76  vessel]
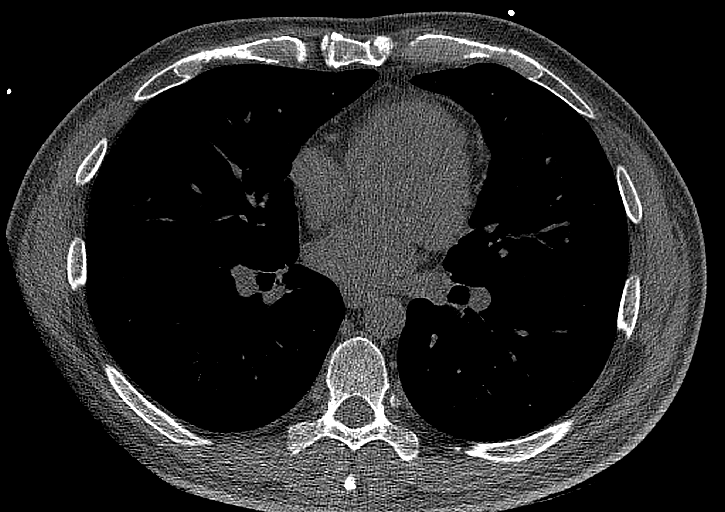
[im 51/76  vessel]
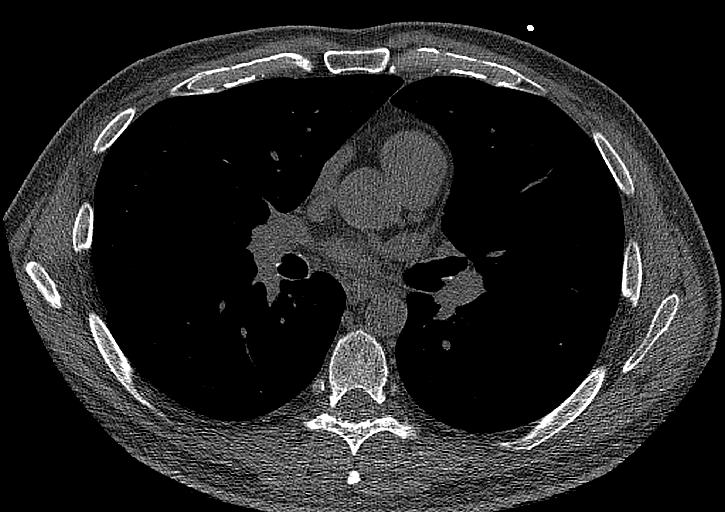
[im 63/76  vessel]
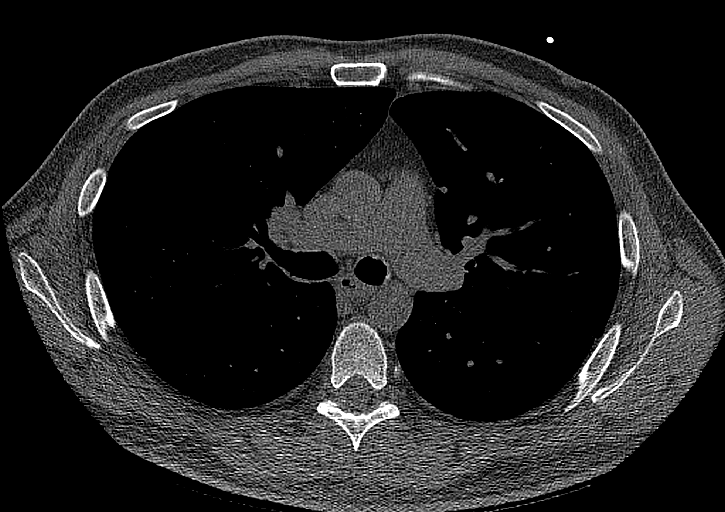

[13 of 20 positions shown; findings below may reference images not displayed]

FINDINGS: CORONARY CALCIUM SCORES:

Left Main: 0

LAD: 7

LCx: 0

RCA: 0

Total Agatston Score: 7

[HOSPITAL] percentile: 63

AORTA MEASUREMENTS:

Ascending Aorta: 24 mm

Descending Aorta: 20 mm

OTHER FINDINGS:

Heart is normal size. Aorta normal caliber. No adenopathy. No
confluent opacities or effusions. Imaging into the upper abdomen
demonstrates no acute findings. Chest wall soft tissues are
unremarkable. No acute bony abnormality.
IMPRESSION: Total Agatston score: 7

[HOSPITAL] percentile: Sixty-three

No acute or significant extracardiac abnormality.

## 2024-05-10 ENCOUNTER — Emergency Department (HOSPITAL_BASED_OUTPATIENT_CLINIC_OR_DEPARTMENT_OTHER)

## 2024-05-10 ENCOUNTER — Encounter (HOSPITAL_BASED_OUTPATIENT_CLINIC_OR_DEPARTMENT_OTHER): Payer: Self-pay | Admitting: Emergency Medicine

## 2024-05-10 ENCOUNTER — Other Ambulatory Visit: Payer: Self-pay

## 2024-05-10 ENCOUNTER — Inpatient Hospital Stay (HOSPITAL_BASED_OUTPATIENT_CLINIC_OR_DEPARTMENT_OTHER)
Admission: EM | Admit: 2024-05-10 | Discharge: 2024-05-13 | Disposition: A | Discharge status: Home / Self Care | Attending: Hospitalist | Admitting: Hospitalist

## 2024-05-10 DIAGNOSIS — Z79899 Other long term (current) drug therapy: Secondary | ICD-10-CM | POA: Diagnosis not present

## 2024-05-10 DIAGNOSIS — Z7901 Long term (current) use of anticoagulants: Secondary | ICD-10-CM | POA: Diagnosis not present

## 2024-05-10 DIAGNOSIS — Z635 Disruption of family by separation and divorce: Secondary | ICD-10-CM

## 2024-05-10 DIAGNOSIS — J9811 Atelectasis: Secondary | ICD-10-CM | POA: Diagnosis present

## 2024-05-10 DIAGNOSIS — F17290 Nicotine dependence, other tobacco product, uncomplicated: Secondary | ICD-10-CM | POA: Diagnosis present

## 2024-05-10 DIAGNOSIS — F419 Anxiety disorder, unspecified: Secondary | ICD-10-CM

## 2024-05-10 DIAGNOSIS — I10 Essential (primary) hypertension: Secondary | ICD-10-CM | POA: Diagnosis present

## 2024-05-10 DIAGNOSIS — I4891 Unspecified atrial fibrillation: Secondary | ICD-10-CM | POA: Diagnosis not present

## 2024-05-10 DIAGNOSIS — K219 Gastro-esophageal reflux disease without esophagitis: Secondary | ICD-10-CM | POA: Diagnosis present

## 2024-05-10 DIAGNOSIS — I2693 Single subsegmental pulmonary embolism without acute cor pulmonale: Secondary | ICD-10-CM | POA: Diagnosis present

## 2024-05-10 DIAGNOSIS — I493 Ventricular premature depolarization: Secondary | ICD-10-CM | POA: Diagnosis present

## 2024-05-10 DIAGNOSIS — Z79621 Long term (current) use of calcineurin inhibitor: Secondary | ICD-10-CM | POA: Diagnosis not present

## 2024-05-10 DIAGNOSIS — I959 Hypotension, unspecified: Secondary | ICD-10-CM | POA: Diagnosis present

## 2024-05-10 DIAGNOSIS — I480 Paroxysmal atrial fibrillation: Secondary | ICD-10-CM | POA: Diagnosis present

## 2024-05-10 DIAGNOSIS — I82441 Acute embolism and thrombosis of right tibial vein: Secondary | ICD-10-CM

## 2024-05-10 DIAGNOSIS — R012 Other cardiac sounds: Secondary | ICD-10-CM | POA: Diagnosis not present

## 2024-05-10 DIAGNOSIS — I2699 Other pulmonary embolism without acute cor pulmonale: Secondary | ICD-10-CM | POA: Diagnosis present

## 2024-05-10 DIAGNOSIS — Z803 Family history of malignant neoplasm of breast: Secondary | ICD-10-CM

## 2024-05-10 DIAGNOSIS — E785 Hyperlipidemia, unspecified: Secondary | ICD-10-CM | POA: Diagnosis present

## 2024-05-10 DIAGNOSIS — Z7982 Long term (current) use of aspirin: Secondary | ICD-10-CM

## 2024-05-10 LAB — CBC WITH DIFFERENTIAL/PLATELET
Abs Immature Granulocytes: 0.01 K/uL (ref 0.00–0.07)
Basophils Absolute: 0 K/uL (ref 0.0–0.1)
Basophils Relative: 1 %
Eosinophils Absolute: 0.2 K/uL (ref 0.0–0.5)
Eosinophils Relative: 2 %
HCT: 43.8 % (ref 39.0–52.0)
Hemoglobin: 14.7 g/dL (ref 13.0–17.0)
Immature Granulocytes: 0 %
Lymphocytes Relative: 42 %
Lymphs Abs: 3.2 K/uL (ref 0.7–4.0)
MCH: 30.3 pg (ref 26.0–34.0)
MCHC: 33.6 g/dL (ref 30.0–36.0)
MCV: 90.3 fL (ref 80.0–100.0)
Monocytes Absolute: 0.6 K/uL (ref 0.1–1.0)
Monocytes Relative: 8 %
Neutro Abs: 3.5 K/uL (ref 1.7–7.7)
Neutrophils Relative %: 47 %
Platelets: 227 K/uL (ref 150–400)
RBC: 4.85 MIL/uL (ref 4.22–5.81)
RDW: 12.9 % (ref 11.5–15.5)
WBC: 7.6 K/uL (ref 4.0–10.5)
nRBC: 0 % (ref 0.0–0.2)

## 2024-05-10 LAB — BASIC METABOLIC PANEL WITH GFR
Anion gap: 12 (ref 5–15)
BUN: 18 mg/dL (ref 6–20)
CO2: 28 mmol/L (ref 22–32)
Calcium: 9.3 mg/dL (ref 8.9–10.3)
Chloride: 101 mmol/L (ref 98–111)
Creatinine, Ser: 0.99 mg/dL (ref 0.61–1.24)
GFR, Estimated: >60 mL/min
Glucose, Bld: 121 mg/dL — ABNORMAL HIGH (ref 70–99)
Potassium: 3.9 mmol/L (ref 3.5–5.1)
Sodium: 141 mmol/L (ref 135–145)

## 2024-05-10 LAB — URINE DRUG SCREEN
Amphetamines: NEGATIVE
Barbiturates: NEGATIVE
Benzodiazepines: NEGATIVE
Cocaine: NEGATIVE
Fentanyl: NEGATIVE
Methadone Scn, Ur: NEGATIVE
Opiates: NEGATIVE
Tetrahydrocannabinol: NEGATIVE

## 2024-05-10 LAB — APTT: aPTT: 120 s — ABNORMAL HIGH (ref 24–36)

## 2024-05-10 LAB — TROPONIN T, HIGH SENSITIVITY
Troponin T High Sensitivity: <15 ng/L (ref 0–19)
Troponin T High Sensitivity: <15 ng/L (ref 0–19)

## 2024-05-10 LAB — HEPARIN LEVEL (UNFRACTIONATED): Heparin Unfractionated: >1.1 [IU]/mL — ABNORMAL HIGH (ref 0.30–0.70)

## 2024-05-10 LAB — TSH: TSH: 2.08 u[IU]/mL (ref 0.350–4.500)

## 2024-05-10 LAB — ETHANOL: Alcohol, Ethyl (B): <15 mg/dL

## 2024-05-10 MED ORDER — ALPRAZOLAM 0.5 MG PO TABS: 0.5000 mg | ORAL_TABLET | Freq: Every day | ORAL | Status: DC

## 2024-05-10 MED ORDER — DIGOXIN 0.25 MG/ML IJ SOLN
500.0000 ug | Freq: Once | INTRAMUSCULAR | Status: AC
  Administered 2024-05-10: 500 ug via INTRAVENOUS
  Filled 2024-05-10: qty 2

## 2024-05-10 MED ORDER — ALPRAZOLAM 0.5 MG PO TABS
1.0000 mg | ORAL_TABLET | Freq: Every day | ORAL | Status: DC
  Administered 2024-05-10 – 2024-05-12 (×3): 1 mg via ORAL
  Filled 2024-05-10 (×3): qty 2

## 2024-05-10 MED ORDER — HEPARIN BOLUS VIA INFUSION
2500.0000 [IU] | Freq: Once | INTRAVENOUS | Status: AC
  Administered 2024-05-10: 2500 [IU] via INTRAVENOUS

## 2024-05-10 MED ORDER — ASPIRIN 81 MG PO CHEW
81.0000 mg | CHEWABLE_TABLET | Freq: Every day | ORAL | Status: DC
  Administered 2024-05-10: 81 mg via ORAL
  Filled 2024-05-10: qty 1

## 2024-05-10 MED ORDER — DILTIAZEM HCL-DEXTROSE 125-5 MG/125ML-% IV SOLN (PREMIX)
5.0000 mg/h | INTRAVENOUS | Status: DC
  Administered 2024-05-10: 5 mg/h via INTRAVENOUS
  Filled 2024-05-10: qty 125

## 2024-05-10 MED ORDER — SODIUM CHLORIDE 0.9 % IV BOLUS
500.0000 mL | Freq: Once | INTRAVENOUS | Status: AC
  Administered 2024-05-10: 500 mL via INTRAVENOUS

## 2024-05-10 MED ORDER — ACETAMINOPHEN 325 MG PO TABS: 650.0000 mg | ORAL_TABLET | Freq: Four times a day (QID) | ORAL | Status: DC | PRN

## 2024-05-10 MED ORDER — APIXABAN 2.5 MG PO TABS
5.0000 mg | ORAL_TABLET | Freq: Two times a day (BID) | ORAL | Status: DC
  Administered 2024-05-10: 5 mg via ORAL
  Filled 2024-05-10: qty 2

## 2024-05-10 MED ORDER — IOHEXOL 350 MG/ML SOLN
75.0000 mL | Freq: Once | INTRAVENOUS | Status: AC | PRN
  Administered 2024-05-10: 75 mL via INTRAVENOUS

## 2024-05-10 MED ORDER — SENNOSIDES-DOCUSATE SODIUM 8.6-50 MG PO TABS: 1.0000 | ORAL_TABLET | Freq: Every evening | ORAL | Status: DC | PRN

## 2024-05-10 MED ORDER — HEPARIN (PORCINE) 25000 UT/250ML-% IV SOLN: 1300.0000 [IU]/h | INTRAVENOUS | Status: DC

## 2024-05-10 MED ORDER — DILTIAZEM LOAD VIA INFUSION
10.0000 mg | Freq: Once | INTRAVENOUS | Status: AC
  Administered 2024-05-10: 10 mg via INTRAVENOUS
  Filled 2024-05-10: qty 10

## 2024-05-10 MED ORDER — HEPARIN (PORCINE) 25000 UT/250ML-% IV SOLN
1400.0000 [IU]/h | INTRAVENOUS | Status: DC
  Administered 2024-05-10: 1600 [IU]/h via INTRAVENOUS
  Filled 2024-05-10: qty 250

## 2024-05-10 MED ORDER — RIVAROXABAN 20 MG PO TABS: 20.0000 mg | ORAL_TABLET | Freq: Every day | ORAL | Status: DC

## 2024-05-10 MED ORDER — RIVAROXABAN 15 MG PO TABS
15.0000 mg | ORAL_TABLET | Freq: Two times a day (BID) | ORAL | Status: DC
  Administered 2024-05-10 – 2024-05-13 (×6): 15 mg via ORAL
  Filled 2024-05-10 (×6): qty 1

## 2024-05-10 MED ORDER — ACETAMINOPHEN 650 MG RE SUPP: 650.0000 mg | Freq: Four times a day (QID) | RECTAL | Status: DC | PRN

## 2024-05-10 MED ORDER — HEPARIN BOLUS VIA INFUSION: 4000.0000 [IU] | Freq: Once | INTRAVENOUS | Status: DC

## 2024-05-10 MED ORDER — SODIUM CHLORIDE 0.9 % IV BOLUS
1000.0000 mL | Freq: Once | INTRAVENOUS | Status: AC
  Administered 2024-05-10: 1000 mL via INTRAVENOUS

## 2024-05-10 MED ORDER — HEPARIN (PORCINE) 25000 UT/250ML-% IV SOLN: 12.0000 [IU]/kg/h | INTRAVENOUS | Status: DC

## 2024-05-10 MED ORDER — RIVAROXABAN (XARELTO) EDUCATION KIT FOR DVT/PE PATIENTS
PACK | Freq: Once | Status: DC
  Filled 2024-05-10: qty 1

## 2024-05-10 MED ORDER — ATORVASTATIN CALCIUM 10 MG PO TABS
20.0000 mg | ORAL_TABLET | Freq: Every day | ORAL | Status: DC
  Administered 2024-05-10 – 2024-05-12 (×3): 20 mg via ORAL
  Filled 2024-05-10 (×3): qty 2

## 2024-05-10 MED ORDER — METOPROLOL TARTRATE 25 MG PO TABS
25.0000 mg | ORAL_TABLET | Freq: Four times a day (QID) | ORAL | Status: DC
  Administered 2024-05-10 – 2024-05-11 (×2): 25 mg via ORAL
  Filled 2024-05-10 (×2): qty 1

## 2024-05-10 MED ORDER — ONDANSETRON HCL 4 MG PO TABS: 4.0000 mg | ORAL_TABLET | Freq: Four times a day (QID) | ORAL | Status: DC | PRN

## 2024-05-10 MED ORDER — ONDANSETRON HCL 4 MG/2ML IJ SOLN: 4.0000 mg | Freq: Four times a day (QID) | INTRAMUSCULAR | Status: DC | PRN

## 2024-05-10 MED ORDER — DIGOXIN 0.25 MG/ML IJ SOLN
250.0000 ug | Freq: Once | INTRAMUSCULAR | Status: AC
  Administered 2024-05-10: 250 ug via INTRAVENOUS
  Filled 2024-05-10: qty 1

## 2024-05-10 NOTE — Progress Notes (Addendum)
 PHARMACY - ANTICOAGULATION CONSULT NOTE  Pharmacy Consult for heparin  Indication: pulmonary embolus, atrial fibrillation    Allergies[1]  Patient Measurements: Height: 6' 0.5 (184.2 cm) Weight: 97.5 kg (215 lb) IBW/kg (Calculated) : 78.75 HEPARIN  DW (KG): 97.5  Vital Signs: Temp: 97.9 F (36.6 C) (12/21 0542) Temp Source: Oral (12/21 0542) BP: 104/81 (12/21 0550) Pulse Rate: 75 (12/21 0550)  Labs: Recent Labs    05/10/24 0130  HGB 14.7  HCT 43.8  PLT 227  CREATININE 0.99    Estimated Creatinine Clearance: 102.9 mL/min (by C-G formula based on SCr of 0.99 mg/dL).   Medical History: Past Medical History:  Diagnosis Date   A-fib (HCC) 12/07/13   Dysrhythmia    afib   HLD (hyperlipidemia)    PVC's (premature ventricular contractions)    Transient atrial fibrillation or flutter    Assessment: 80 yoM presented with atrial fibrillation and started on eliquis  (PMH of afib, no PTA oral anticoagulation) now with new found pulmonary embolism   -CBC stable -Last dose of apixaban  12/21 @0215  (will monitor with aPTTs given anti-Xa levels will be falsely elevated) -CT: PE RLL (no RHS identified); patient asymptomatic and satting at 94% on RA  Goal of Therapy:  Heparin  level 0.3-0.7 units/ml aPTT 66-102 seconds Monitor platelets by anticoagulation protocol: Yes   Plan:  Give 2500 units bolus x 1 given received only 5mg  dose of eliquis  (half the dose needed for PE) Start heparin  infusion at 1600 units/hr Check aPTT and anti-Xa level in 6 hours and daily while on heparin  Monitor with aPTT until correlates with anti-Xa level Continue to monitor H&H and platelets  Lynwood Poplar, PharmD, BCPS Clinical Pharmacist 05/10/2024 6:51 AM        [1] No Known Allergies

## 2024-05-10 NOTE — Consult Note (Signed)
 " Electrophysiology Consultation:   Patient ID: DELSHAWN Whitaker MRN: [id number]; DOB: [birth year]  Admit date: 05/10/2024 Date of Consult: 05/10/2024  Primary Care Provider: Delice Charleston, MD (Inactive) [facility] Cardiologist: None  [facility] Electrophysiologist:  None   Patient Profile:   Edwin Whitaker is a 53 y.o. 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 Marcene.  History of Present Illness:   [name] had some dyspnea on exertion and his Apple Watch notified him of a fast heart rate for 30 minutes duration which prompted his ED evaluation.  He has history of paroxysmal atrial fibrillation and was found to have recurrent AF on admission and was started on diltiazem  gtt at 5 mg/h for rat control.  Initially his AF/RVR was in the 160s however improved with VR to the low 100s.  His blood pressure subsequently dropped with systolics in the 70s at which time the diltiazem  was stopped.  He was given 500 cc IVF.  He was transferred from [facility] to [facility] for further management.  Prior to transfer he had received around 3 L IVFs for persistent hypotension.   CTPE with with segmental and subsegmental PE of the RLL PA.  He was not previously on OAC as an outpatient.   His main reason for presentation was palpitations which she had felt before with PACs and PVCs.  He was about to get in bed when this happened which prompted his subsequent evaluation emergency department.  He had no symptoms related to pulmonary embolism.  He has no risk factors for PE.  He was started on heparin  gtt for acute tx of PE and for AF.   Past Medical History:  Diagnosis Date   A-fib (HCC) 12/07/13   Dysrhythmia    afib   HLD (hyperlipidemia)    PVC's (premature ventricular contractions)    Transient atrial fibrillation or flutter    Past Surgical History:  Procedure Laterality  Date   CARDIOVERSION N/A 12/07/2013   Procedure: CARDIOVERSION;  Surgeon: Erick JONELLE Bergamo, MD;  Location: [facility] ENDOSCOPY;  Service: Cardiovascular;  Laterality: N/A;   none      Inpatient Medications: Scheduled Meds:  ALPRAZolam   1 mg Oral QHS   aspirin   81 mg Oral Daily   atorvastatin   20 mg Oral QHS   digoxin   250 mcg Intravenous Once   Continuous Infusions:  heparin  1,600 Units/hr (05/10/24 9297)   Allergies:   Allergies[1]  Social History:   Social History   Socioeconomic History   Marital status: Legally Separated    Spouse name: Luke   Number of children: 3   Years of education: 16   Highest education level: Not on file  Occupational History   Occupation: Acupuncturist: [employer]  Tobacco Use   Smoking status: Former    Current packs/day: 0.00    Average packs/day: 1.5 packs/day for 23.0 years (34.5 ttl pk-yrs)    Types: Cigarettes    Start date: 10/09/1986    Quit date: 10/08/2009    Years since quitting: 14.5   Smokeless tobacco: Never  Vaping Use   Vaping status: Every Day  Substance and Sexual Activity   Alcohol use: Yes    Comment: per night    Drug use: No   Sexual activity: Yes    Partners: Female  Other Topics Concern  Not on file  Social History Narrative   Patient is right handed and resides with wife and children   Social Drivers of Health   Tobacco Use: Medium Risk (05/10/2024)   Patient History    Smoking Tobacco Use: Former    Smokeless Tobacco Use: Never    Passive Exposure: Not on Actuary Strain: Not on file  Food Insecurity: Not on file  Transportation Needs: Not on file  Physical Activity: Not on file  Stress: Not on file  Social Connections: Not on file  Intimate Partner Violence: Not on file  Depression (PHQ2-9): Not on file  Alcohol Screen: Not on file  Housing: Not on file  Utilities: Not on file  Health Literacy: Not on file    Family History:    Family History  Problem  Relation Age of Onset   Atrial fibrillation Father    Allergies Father    Breast cancer Sister      ROS:  Review of Systems: [y] = yes, [ ]  = no      General: Weight gain [ ] ; Weight loss [ ] ; Anorexia [ ] ; Fatigue [ ] ; Fever [ ] ; Chills [ ] ; Weakness [ ]    Cardiac: Chest pain/pressure [ ] ; Resting SOB [ ] ; Exertional SOB [ ] ; Orthopnea [ ] ; Pedal Edema [ ] ; Palpitations [y]; Syncope [ ] ; Presyncope [ ] ; Paroxysmal nocturnal dyspnea [ ]    Pulmonary: Cough [ ] ; Wheezing [ ] ; Hemoptysis [ ] ; Sputum [ ] ; Snoring [ ]    GI: Vomiting [ ] ; Dysphagia [ ] ; Melena [ ] ; Hematochezia [ ] ; Heartburn [ ] ; Abdominal pain [ ] ; Constipation [ ] ; Diarrhea [ ] ; BRBPR [ ]    GU: Hematuria [ ] ; Dysuria [ ] ; Nocturia [ ]  Vascular: Pain in legs with walking [ ] ; Pain in feet with lying flat [ ] ; Non-healing sores [ ] ; Stroke [ ] ; TIA [ ] ; Slurred speech [ ] ;   Neuro: Headaches [ ] ; Vertigo [ ] ; Seizures [ ] ; Paresthesias [ ] ;Blurred vision [ ] ; Diplopia [ ] ; Vision changes [ ]    Ortho/Skin: Arthritis [ ] ; Joint pain [ ] ; Muscle pain [ ] ; Joint swelling [ ] ; Back Pain [ ] ; Rash [ ]    Psych: Depression [ ] ; Anxiety [ ]    Heme: Bleeding problems [ ] ; Clotting disorders [ ] ; Anemia [ ]    Endocrine: Diabetes [ ] ; Thyroid dysfunction [ ]    Physical Exam/Data:   Vitals:   05/10/24 1330 05/10/24 1415 05/10/24 1430 05/10/24 1523  BP: 101/78 98/80 108/89 114/80  Pulse: 93 92 84 (!) 110  Resp: 19 18 18 18   Temp:    97.9 F (36.6 C)  TempSrc:    Oral  SpO2: 94% 99% 99% 98%  Weight:    99.2 kg  Height:    6' (1.829 m)   Intake/Output Summary (Last 24 hours) at 05/10/2024 1538 Last data filed at 05/10/2024 0909 Gross per 24 hour  Intake 3002.2 ml  Output --  Net 3002.2 ml      05/10/2024    3:23 PM 05/10/2024    1:32 AM 12/30/2013    8:21 AM  Last 3 Weights  Weight (lbs) 218 lb 12.8 oz 215 lb 213 lb  Weight (kg) 99.247 kg 97.523 kg 96.616 kg     Body mass index is 29.67 kg/m.  General:  Well nourished,  well developed, in no acute distress*** HEENT: normal Lymph: no adenopathy Neck: no JVD Endocrine:  No  thryomegaly Vascular: No carotid bruits; FA pulses 2+ bilaterally without bruits  Cardiac:  normal S1, S2; RRR; no murmur *** Lungs:  clear to auscultation bilaterally, no wheezing, rhonchi or rales  Abd: soft, nontender, no hepatomegaly  Ext: no edema Musculoskeletal:  No deformities, BUE and BLE strength normal and equal Skin: warm and dry  Neuro:  CNs 2-12 intact, no focal abnormalities noted Psych:  Normal affect   ECG review 05/10/24: (04:58:50) AF/VR 109, QRS 85, QT/c 316/426 05/10/24: (01:22:42) AF/RVR 142, QRS 93, QT/c 304/468 12/07/13: NSR 91, PR 130, QRS 86, QT/c 328/403 04/25/12: NSR 69, PR 134, QRS 86, QT/c 370/396  Telemetry:  Telemetry was personally reviewed and demonstrates:  ***  Relevant CV Studies: TTE pending   Laboratory Data:  Chemistry Recent Labs  Lab 05/10/24 0130  NA 141  K 3.9  CL 101  CO2 28  GLUCOSE 121*  BUN 18  CREATININE 0.99  CALCIUM  9.3  GFRNONAA >60  ANIONGAP 12    Hematology Recent Labs  Lab 05/10/24 0130  WBC 7.6  RBC 4.85  HGB 14.7  HCT 43.8  MCV 90.3  MCH 30.3  MCHC 33.6  RDW 12.9  PLT 227   Radiology/Studies:  US  Venous Img Lower Bilateral Result Date: 05/10/2024 CLINICAL DATA:  Pulmonary embolism.  Evaluate for DVT. EXAM: BILATERAL LOWER EXTREMITY VENOUS DOPPLER ULTRASOUND TECHNIQUE: Gray-scale sonography with graded compression, as well as color Doppler and duplex ultrasound were performed to evaluate the lower extremity deep venous systems from the level of the common femoral vein and including the common femoral, femoral, profunda femoral, popliteal and calf veins including the posterior tibial, peroneal and gastrocnemius veins when visible. The superficial great saphenous vein was also interrogated. Spectral Doppler was utilized to evaluate flow at rest and with distal augmentation maneuvers in the common  femoral, femoral and popliteal veins. COMPARISON:  None Available. FINDINGS: RIGHT LOWER EXTREMITY Common Femoral Vein: No evidence of thrombus. Normal compressibility, respiratory phasicity and response to augmentation. Saphenofemoral Junction: No evidence of thrombus. Normal compressibility and flow on color Doppler imaging. Profunda Femoral Vein: No evidence of thrombus. Normal compressibility and flow on color Doppler imaging. Femoral Vein: No evidence of thrombus. Normal compressibility, respiratory phasicity and response to augmentation. Popliteal Vein: No evidence of thrombus. Normal compressibility, respiratory phasicity and response to augmentation. Calf Veins: Near occlusive thrombus in the right posterior tibial vein. Superficial Great Saphenous Vein: No evidence of thrombus. Normal compressibility. Venous Reflux:  None. Other Findings:  None. LEFT LOWER EXTREMITY Common Femoral Vein: No evidence of thrombus. Normal compressibility, respiratory phasicity and response to augmentation. Saphenofemoral Junction: No evidence of thrombus. Normal compressibility and flow on color Doppler imaging. Profunda Femoral Vein: No evidence of thrombus. Normal compressibility and flow on color Doppler imaging. Femoral Vein: No evidence of thrombus. Normal compressibility, respiratory phasicity and response to augmentation. Popliteal Vein: No evidence of thrombus. Normal compressibility, respiratory phasicity and response to augmentation. Calf Veins: No evidence of thrombus. Normal compressibility and flow on color Doppler imaging. Superficial Great Saphenous Vein: No evidence of thrombus. Normal compressibility. Venous Reflux:  None. Other Findings:  None. IMPRESSION: Positive for DVT in the right posterior tibial vein. Electronically Signed   By: Vanetta Chou M.D.   On: 05/10/2024 11:29   CT Angio Chest PE W and/or Wo Contrast Result Date: 05/10/2024 EXAM: CTA CHEST 05/10/2024 06:25:00 AM TECHNIQUE: CTA of the  chest was performed without and with the administration of 75 mL of intravenous iohexol  (OMNIPAQUE ) 350 MG/ML injection.  Multiplanar reformatted images are provided for review. MIP images are provided for review. Automated exposure control, iterative reconstruction, and/or weight based adjustment of the mA/kV was utilized to reduce the radiation dose to as low as reasonably achievable. COMPARISON: None available. CLINICAL HISTORY: Syncope/presyncope, cerebrovascular cause suspected. FINDINGS: PULMONARY ARTERIES: Pulmonary arteries are adequately opacified for evaluation. Filling defect within a segmental branch and subsegmental branches of the right lower lobe pulmonary artery is noted posteriorly, axial image 78/6 and axial image 76/6. Main pulmonary artery is normal in caliber. RV to LV ratio is equal to 1.1. MEDIASTINUM: Normal heart size. No pericardial effusion. There is no acute abnormality of the thoracic aorta. LYMPH NODES: No mediastinal, hilar or axillary lymphadenopathy. LUNGS AND PLEURA: Mild emphysema with diffuse bronchial wall thickening. Dependent ground-glass attenuation identified within the posterior lung bases. Areas of atelectasis or scar noted within the imaged portions of the lung bases including both lower lobes, lingular, and right middle lobe. Calcified granuloma identified within the superior segment of the right lower lobe. No pneumothorax or consolidative change. No evidence of pleural effusion. UPPER ABDOMEN: Limited images of the upper abdomen are unremarkable. SOFT TISSUES AND BONES: No acute bone or soft tissue abnormality. IMPRESSION: 1. Pulmonary embolism involving a segmental branch and subsegmental branches of the right lower lobe pulmonary artery. 2. Emphysema. 3. Critical results were called to the ordering provider at the time of interpretation on 05/10/2024. I personally spoke with Dr. Palumbo, who acknowledged these findings. Electronically signed by: Waddell Calk MD  05/10/2024 06:40 AM EST RP Workstation: [id number]   DG Chest Portable 1 View Result Date: 05/10/2024 EXAM: 1 VIEW(S) XRAY OF THE CHEST 05/10/2024 01:44:00 AM COMPARISON: None available. CLINICAL HISTORY: painetvc FINDINGS: LUNGS AND PLEURA: Bibasilar atelectasis. No pleural effusion. No pneumothorax. HEART AND MEDIASTINUM: No acute abnormality of the cardiac and mediastinal silhouettes. BONES AND SOFT TISSUES: No acute osseous abnormality. IMPRESSION: 1. No acute cardiopulmonary abnormality. 2. Mild bibasilar atelectasis. Electronically signed by: Oneil Devonshire MD 05/10/2024 01:51 AM EST RP Workstation: [id number]   CHA2DS2-VASc Score = 0  This indicates a 0.2% annual risk of stroke. The patient's score is based upon: CHF History: 0 HTN History: 0 Diabetes History: 0 Stroke History: 0 Vascular Disease History: 0 Age Score: 0 Gender Score: 0  Assessment and Plan:  Edwin Whitaker is a 53 y.o. 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 Marcene.  AF/RVR Segmental/subsegmental PE  C2V  {Are we signing off today?:[id number]}  For questions or updates, please contact [facility] Please consult [email or web address] for contact info under   Signed, Donnice DELENA Primus, MD  05/10/2024 3:38 PM     [1] No Known Allergies  "

## 2024-05-10 NOTE — Assessment & Plan Note (Addendum)
 EDP discussed with cardiology who recommends digoxin  500 mcg IV one-time dose in the morning and digoxin  250 mcg IV one-time dose in the evening on day of admission Continue telemetry Complete echo ordered Per EDP, cardiology will see patient once he gets to [facility] bed

## 2024-05-10 NOTE — ED Notes (Addendum)
 [facility] in to transport PT/INR not obtained prior to transfer. Pharmacist notified. Advised to notify nurse receiving pt

## 2024-05-10 NOTE — Assessment & Plan Note (Signed)
 Continue heparin GTT

## 2024-05-10 NOTE — Hospital Course (Signed)
 [name] is a 53 year old male with history of hyperlipidemia, hypertension, GERD, anxiety, history of PVCs on as needed propranolol .    05/09/2024: he presents ED for chief concerns of perceiving irregular heart rhythm.  Vitals at the time of my evaluation showed t of 97.9, rr 17, hr 117, blood pressure 82/67 and improved to 103/86, SpO2 99% on room air.  Serum sodium 141, potassium 3.9, chloride 101, bicarb 28, BUN of 18, serum creatinine 0.99, eGFR greater than 60, nonfasting glucose 121, WBC 7.6, hemoglobin 14.7, platelets of 227.  ED treatment: Heparin  bolus and gtt.  05/10/2024: Patient admitted to [facility] hospitalist service for chief concern of segmental and subsegmental PE in the right lower lobes.

## 2024-05-10 NOTE — ED Notes (Signed)
 Virtual hospitalist at bedside

## 2024-05-10 NOTE — ED Notes (Signed)
 Called bed placement in regards to bed availably, left VM.

## 2024-05-10 NOTE — Progress Notes (Signed)
 ON-CALL CARDIOLOGY 05/10/2024  Patient's name: Edwin Whitaker.   MRN: [id number].    DOB: [birth year] Primary care provider: Delice Charleston, MD (Inactive).  Requesting physician: Dr. Lenor at [facility] Consulting physician: Dr. Michele at [facility]  Interaction regarding this patient's care today: Patient presents to the ED with symptoms of palpitations.  Newly diagnosed atrial fibrillation with rapid ventricular rate and right-sided acute PE.  ED physician reached out to discuss his case with regards to A-fib management  Patient was placed on IV heparin  for thromboembolic prophylaxis.  Patient was also started on Cardizem  drip and later discontinued at night due to soft blood pressures patient's ventricular rate still remains elevated and further recommendations were requested  I was informed that hemodynamically he is stable. Has received somewhat of 3 L of IV fluids.   Impression:   ICD-10-CM   1. Atrial fibrillation with rapid ventricular response (HCC)  I48.91     2. Acute pulmonary embolism without acute cor pulmonale, unspecified pulmonary embolism type (HCC)  I26.99       Recommendations: Continue IV heparin  for Afib and PE and if no current indications may transition to oral anticoagulation.  Digoxin  500 mcg x 1 IV push now, followed by 250 mcg x 1 IV push 6 hours later.  Gentle hydration, with IV fluids.  May consider amiodarone  if rates are difficult to control.  Cardiology requested to see in consult once he arrives to [facility].   [place], [place], FACC [facility]  A Division of [facility] [address] [place], [place] [address]  05/10/2024 9:12 AM

## 2024-05-10 NOTE — Progress Notes (Signed)
" ° °  Brief Progress Note   _____________________________________________________________________________________________________________  Patient Name: Edwin Whitaker Patient DOB: November 20, 1970 Date: @TODAY @      Data: The patient is currently awaiting placement in a Progressive bed at [facility].     Action: Reached out to Dr. Sherre at [phone] to inquire if the patient could be admitted to [facility] in order to help level-load admissions from [facility].     Response:  Per Dr. Sherre, the request will be reviewed once the RN placement team sends the patient list around 8:00 AM. Awaiting further review and confirmation from Dr. Sherre after receiving pt list.    _____________________________________________________________________________________________________________  [facility] RN Expeditor Taysha Majewski S Idolina Mantell Please contact us  directly via secure chat (search for [facility]) or by calling us  at [phone] [facility]).  "

## 2024-05-10 NOTE — ED Notes (Signed)
 EDP notified of pt SBP trending 90s-108 while on Diltiazem  drip. Per EDP instruction continue at rate of 5mg /h and bolus orders placed. Pt tolerating well, with no new complaints.

## 2024-05-10 NOTE — ED Notes (Signed)
 EDP updated with current SPB in 70s on Diltiazem  drip. EDP v/o to dc Diltiazem  drip at this time. Pt sleeping, awakens to voice. Denies any sx at this time.

## 2024-05-10 NOTE — ED Notes (Addendum)
 EDP updated with current VS during Diltiazem  infusion. Bolus order given. Pt sleepy but responds to voice.

## 2024-05-10 NOTE — ED Notes (Signed)
 Recalled Bed Placement regarding bed assignment, left VM

## 2024-05-10 NOTE — Consult Note (Incomplete)
 "  Cardiology Consultation   Patient ID: ELBA DENDINGER MRN: [id number]; DOB: 1971-03-03  Admit date: 05/10/2024 Date of Consult: 05/10/2024  PCP:  Delice Charleston, MD (Inactive)    HeartCare Providers Cardiologist:  None   { Click here to update MD or APP on Care Team, Refresh:1}     Patient Profile: KOLESON REIFSTECK is a 53 y.o. 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  History of Present Illness: Mr. Allinson has cardiac history as mentioned above. Presented to [facility] for tachycardia, patient was notified by his apple watch.  At [facility]: BP: 108/78 ECG: AF VR 142 with follow up AF VR 109 with J point elevation in inferior and I, V6.  CTA Chest showed PE and emphysema  +DVT to right posterior tibial vein  Pertinent lab values CMET and CBC with diff WNL Negative Troponin and UDS  Patient was placed on IV heparin . Cardiology was consulted as patient developed hypotension on IV cardizem . Dr. Michele advised to give digoxin  with gentle IV fluids. Patient has not also been transitioned to eliquis . Patient was transferred to [facility].   On interview, ***  Past Medical History:  Diagnosis Date   A-fib (HCC) 12/07/13   Dysrhythmia    afib   HLD (hyperlipidemia)    PVC's (premature ventricular contractions)    Transient atrial fibrillation or flutter     Past Surgical History:  Procedure Laterality Date   CARDIOVERSION N/A 12/07/2013   Procedure: CARDIOVERSION;  Surgeon: Erick JONELLE Bergamo, MD;  Location: [facility] ENDOSCOPY;  Service: Cardiovascular;  Laterality: N/A;   none       {Home Medications (Optional):21181}  Scheduled Meds:  ALPRAZolam   1 mg Oral QHS   aspirin   81 mg Oral Daily   atorvastatin   20 mg Oral QHS   digoxin   250 mcg Intravenous Once    Continuous Infusions:  heparin  1,600 Units/hr (05/10/24 0702)   PRN Meds: acetaminophen  **OR** acetaminophen , ondansetron  **OR** ondansetron  (ZOFRAN ) IV, senna-docusate  Allergies:   Allergies[1]  Social History:   Social History   Socioeconomic History   Marital status: Legally Separated    Spouse name: Luke   Number of children: 3   Years of education: 16   Highest education level: Not on file  Occupational History   Occupation: Acupuncturist: [employer]  Tobacco Use   Smoking status: Former    Current packs/day: 0.00    Average packs/day: 1.5 packs/day for 23.0 years (34.5 ttl pk-yrs)    Types: Cigarettes    Start date: 10/09/1986    Quit date: 10/08/2009    Years since quitting: 14.5   Smokeless tobacco: Never  Vaping Use   Vaping status: Every Day  Substance and Sexual Activity   Alcohol use: Yes    Comment: per night    Drug use: No   Sexual activity: Yes    Partners: Female  Other Topics Concern   Not on file  Social History Narrative   Patient is right handed and resides with wife and children   Social Drivers of Health   Tobacco Use: Medium Risk (05/10/2024)   Patient History    Smoking Tobacco Use: Former    Smokeless Tobacco Use: Never    Passive Exposure: Not on Hospital Doctor  Resource Strain: Not on file  Food Insecurity: Not on file  Transportation Needs: Not on file  Physical Activity: Not on file  Stress: Not on file  Social Connections: Not on file  Intimate Partner Violence: Not on file  Depression (EYV7-0): Not on file  Alcohol Screen: Not on file  Housing: Not on file  Utilities: Not on file  Health Literacy: Not on file    Family History:   Family History  Problem Relation Age of Onset   Atrial fibrillation Father    Allergies Father    Breast cancer Sister      ROS:  Please see the history of present illness.  All other ROS reviewed and negative.     Physical Exam/Data: Vitals:   05/10/24  1215 05/10/24 1245 05/10/24 1330 05/10/24 1415  BP:  100/74 101/78 98/80  Pulse: 69 (!) 128 93 92  Resp: 16 15 19 18   Temp:  98.7 F (37.1 C)    TempSrc:  Oral    SpO2: 100% 94% 94% 99%  Weight:      Height:        Intake/Output Summary (Last 24 hours) at 05/10/2024 1439 Last data filed at 05/10/2024 9090 Gross per 24 hour  Intake 3002.2 ml  Output --  Net 3002.2 ml      05/10/2024    1:32 AM 12/30/2013    8:21 AM 12/07/2013    3:00 PM  Last 3 Weights  Weight (lbs) 215 lb 213 lb 217 lb  Weight (kg) 97.523 kg 96.616 kg 98.431 kg     Body mass index is 28.76 kg/m.  General:  Well nourished, well developed, in no acute distress*** HEENT: normal Neck: no JVD Vascular: No carotid bruits; Distal pulses 2+ bilaterally Cardiac:  normal S1, S2; RRR; no murmur *** Lungs:  clear to auscultation bilaterally, no wheezing, rhonchi or rales  Abd: soft, nontender, no hepatomegaly  Ext: no edema Musculoskeletal:  No deformities, BUE and BLE strength normal and equal Skin: warm and dry  Neuro:  CNs 2-12 intact, no focal abnormalities noted Psych:  Normal affect   EKG:  The EKG was personally reviewed and demonstrates:  See HPI Telemetry:  Telemetry was personally reviewed and demonstrates:  ***  Relevant CV Studies: CT CA Score 8/ 29/22  IMPRESSION: Total Agatston score: 7   [place] database percentile: Sixty-three   No acute or significant extracardiac abnormality.  Laboratory Data: High Sensitivity Troponin:   Recent Labs  Lab 05/10/24 0130 05/10/24 0505  TRNPT <15 <15      Chemistry Recent Labs  Lab 05/10/24 0130  NA 141  K 3.9  CL 101  CO2 28  GLUCOSE 121*  BUN 18  CREATININE 0.99  CALCIUM  9.3  GFRNONAA >60  ANIONGAP 12    Hematology Recent Labs  Lab 05/10/24 0130  WBC 7.6  RBC 4.85  HGB 14.7  HCT 43.8  MCV 90.3  MCH 30.3  MCHC 33.6  RDW 12.9  PLT 227    Radiology/Studies:  US  Venous Img Lower Bilateral Result Date: 05/10/2024 CLINICAL  DATA:  Pulmonary embolism.  Evaluate for DVT. EXAM: BILATERAL LOWER EXTREMITY VENOUS DOPPLER ULTRASOUND TECHNIQUE: Gray-scale sonography with graded compression, as well as color Doppler and duplex ultrasound were performed to evaluate the lower extremity deep venous systems from the level of the common femoral vein and including the common femoral, femoral, profunda femoral, popliteal and calf veins including the posterior tibial, peroneal and gastrocnemius veins when visible. The superficial great  saphenous vein was also interrogated. Spectral Doppler was utilized to evaluate flow at rest and with distal augmentation maneuvers in the common femoral, femoral and popliteal veins. COMPARISON:  None Available. FINDINGS: RIGHT LOWER EXTREMITY Common Femoral Vein: No evidence of thrombus. Normal compressibility, respiratory phasicity and response to augmentation. Saphenofemoral Junction: No evidence of thrombus. Normal compressibility and flow on color Doppler imaging. Profunda Femoral Vein: No evidence of thrombus. Normal compressibility and flow on color Doppler imaging. Femoral Vein: No evidence of thrombus. Normal compressibility, respiratory phasicity and response to augmentation. Popliteal Vein: No evidence of thrombus. Normal compressibility, respiratory phasicity and response to augmentation. Calf Veins: Near occlusive thrombus in the right posterior tibial vein. Superficial Great Saphenous Vein: No evidence of thrombus. Normal compressibility. Venous Reflux:  None. Other Findings:  None. LEFT LOWER EXTREMITY Common Femoral Vein: No evidence of thrombus. Normal compressibility, respiratory phasicity and response to augmentation. Saphenofemoral Junction: No evidence of thrombus. Normal compressibility and flow on color Doppler imaging. Profunda Femoral Vein: No evidence of thrombus. Normal compressibility and flow on color Doppler imaging. Femoral Vein: No evidence of thrombus. Normal compressibility, respiratory  phasicity and response to augmentation. Popliteal Vein: No evidence of thrombus. Normal compressibility, respiratory phasicity and response to augmentation. Calf Veins: No evidence of thrombus. Normal compressibility and flow on color Doppler imaging. Superficial Great Saphenous Vein: No evidence of thrombus. Normal compressibility. Venous Reflux:  None. Other Findings:  None. IMPRESSION: Positive for DVT in the right posterior tibial vein. Electronically Signed   By: Vanetta Chou M.D.   On: 05/10/2024 11:29   CT Angio Chest PE W and/or Wo Contrast Result Date: 05/10/2024 EXAM: CTA CHEST 05/10/2024 06:25:00 AM TECHNIQUE: CTA of the chest was performed without and with the administration of 75 mL of intravenous iohexol  (OMNIPAQUE ) 350 MG/ML injection. Multiplanar reformatted images are provided for review. MIP images are provided for review. Automated exposure control, iterative reconstruction, and/or weight based adjustment of the mA/kV was utilized to reduce the radiation dose to as low as reasonably achievable. COMPARISON: None available. CLINICAL HISTORY: Syncope/presyncope, cerebrovascular cause suspected. FINDINGS: PULMONARY ARTERIES: Pulmonary arteries are adequately opacified for evaluation. Filling defect within a segmental branch and subsegmental branches of the right lower lobe pulmonary artery is noted posteriorly, axial image 78/6 and axial image 76/6. Main pulmonary artery is normal in caliber. RV to LV ratio is equal to 1.1. MEDIASTINUM: Normal heart size. No pericardial effusion. There is no acute abnormality of the thoracic aorta. LYMPH NODES: No mediastinal, hilar or axillary lymphadenopathy. LUNGS AND PLEURA: Mild emphysema with diffuse bronchial wall thickening. Dependent ground-glass attenuation identified within the posterior lung bases. Areas of atelectasis or scar noted within the imaged portions of the lung bases including both lower lobes, lingular, and right middle lobe. Calcified  granuloma identified within the superior segment of the right lower lobe. No pneumothorax or consolidative change. No evidence of pleural effusion. UPPER ABDOMEN: Limited images of the upper abdomen are unremarkable. SOFT TISSUES AND BONES: No acute bone or soft tissue abnormality. IMPRESSION: 1. Pulmonary embolism involving a segmental branch and subsegmental branches of the right lower lobe pulmonary artery. 2. Emphysema. 3. Critical results were called to the ordering provider at the time of interpretation on 05/10/2024. I personally spoke with Dr. Palumbo, who acknowledged these findings. Electronically signed by: Waddell Calk MD 05/10/2024 06:40 AM EST RP Workstation: [id number]   DG Chest Portable 1 View Result Date: 05/10/2024 EXAM: 1 VIEW(S) XRAY OF THE CHEST 05/10/2024 01:44:00 AM COMPARISON: None available. CLINICAL  HISTORY: painetvc FINDINGS: LUNGS AND PLEURA: Bibasilar atelectasis. No pleural effusion. No pneumothorax. HEART AND MEDIASTINUM: No acute abnormality of the cardiac and mediastinal silhouettes. BONES AND SOFT TISSUES: No acute osseous abnormality. IMPRESSION: 1. No acute cardiopulmonary abnormality. 2. Mild bibasilar atelectasis. Electronically signed by: Oneil Devonshire MD 05/10/2024 01:51 AM EST RP Workstation: [id number]     Assessment and Plan:  Paroxysmal Atrial Fibrillation, with episode of RVR Hx of AF s/p DCCV in 2015 Currently in *** Chad Vasc score 0 Keep K > 4 and Mag > 2, will order mag *** Order TSH *** Order LFTs ** Echo pending  May not need life long anticoagulation with CHAD VAS score, though on anticoagulation for acute PE as well. For AF will remain on anticoagulation, and pursue TEE/DCCV this week.  *** Continue eliquis  5 mg BID Digoxin  *** AV nodal blocking on hold with hypotension  Hyperlipidemia OSH Lipid panel  LDL 180   HDL 56   TG 183 Follow up on LFTS, then will start statin    Risk Assessment/Risk Scores: {Complete the following score  calculators/questions to meet required metrics.  Press F2         :[id number]}        CHA2DS2-VASc Score = 0  {Click here to calculate score.  REFRESH note before signing. :1} This indicates a 0.2% annual risk of stroke. The patient's score is based upon: CHF History: 0 HTN History: 0 Diabetes History: 0 Stroke History: 0 Vascular Disease History: 0 Age Score: 0 Gender Score: 0         For questions or updates, please contact [facility] Please consult [email or web address] for contact info under    {TIP  Split Shared Billing  Do NOT delete any part of this including brackets If split shared billing is based upon MDM, disregard If billing will be based upon TIME you MUST document the number of minutes and a detailed list of what was done in that time in the following format Example - I spent ** minutes seeing this patient. During that time I reviewed their history, evaluated their symptoms, reviewed available labs, EKGs, studies, performed an exam and formulated an assessment and plan   :1} {Select this only if you need to document critical care time (Optional):[phone]} Signed, Leontine LOISE Salen, PA-C  05/10/2024 2:39 PM     [1] No Known Allergies  "

## 2024-05-10 NOTE — Assessment & Plan Note (Addendum)
 Continue heparin  GTT per pharmacy Ultrasound of the lower extremities has been ordered to assess for DVT Complete echo ordered RV/LV ratio on CT was 1.10, a.m. team to consider consultation to vascular

## 2024-05-10 NOTE — ED Notes (Signed)
 [facility] called for transport.

## 2024-05-10 NOTE — ED Notes (Signed)
 Called bed placement in regards to bed assignment, none up for discharge at this time, spoke with [facility]

## 2024-05-10 NOTE — Progress Notes (Signed)
 PHARMACY - ANTICOAGULATION CONSULT NOTE  Pharmacy Consult for heparin  Indication: pulmonary embolus, atrial fibrillation    Allergies[1]  Patient Measurements: Height: 6' (182.9 cm) Weight: 99.2 kg (218 lb 12.8 oz) IBW/kg (Calculated) : 77.6 HEPARIN  DW (KG): 97.7  Vital Signs: Temp: 97.9 F (36.6 C) (12/21 1523) Temp Source: Oral (12/21 1523) BP: 114/80 (12/21 1523) Pulse Rate: 110 (12/21 1523)  Labs: Recent Labs    05/10/24 0130 05/10/24 1539  HGB 14.7  --   HCT 43.8  --   PLT 227  --   APTT  --  120*  HEPARINUNFRC  --  >1.10*  CREATININE 0.99  --     Estimated Creatinine Clearance: 102.8 mL/min (by C-G formula based on SCr of 0.99 mg/dL).   Medical History: Past Medical History:  Diagnosis Date   A-fib (HCC) 12/07/13   Dysrhythmia    afib   HLD (hyperlipidemia)    PVC's (premature ventricular contractions)    Transient atrial fibrillation or flutter    Assessment: 71 yoM presented with atrial fibrillation and started on eliquis  (PMH of afib, no PTA oral anticoagulation) now with new found pulmonary embolism   -CBC stable -Last dose of apixaban  12/21 @0215  (will monitor with aPTTs given anti-Xa levels will be falsely elevated) -CT: PE RLL (no RHS identified); patient asymptomatic and satting at 94% on RA  PM update: aPTT 120 is supratherapeutic, HL > 1.1 as expected with recent DOAC administration. No issues with the heparin  infusion or bleeding reported per RN.  Goal of Therapy:  Heparin  level 0.3-0.7 units/ml aPTT 66-102 seconds Monitor platelets by anticoagulation protocol: Yes   Plan:  Decrease heparin  infusion to 1400 units/hr Check aPTT in 6 hours and daily Monitor with aPTT until correlates with anti-Xa level Continue to monitor H&H and platelets  Rocky Slade, PharmD, BCPS Clinical Pharmacist 05/10/2024 5:26 PM   Please check AMION for all [facility] phone numbers After 10:00 PM, call Main Pharmacy [phone]         [1] No  Known Allergies

## 2024-05-10 NOTE — H&P (Signed)
 " History and Physical - Telemedicine  [name] FMW:[id number] DOB: [birth year] DOA: 05/10/2024  PCP: Delice Charleston, MD (Inactive)  Patient coming from: Home  Referring provider: Dr. Nettie, EDP Telemedicine provider: Dr. Sherre Patient location: [facility] Referring diagnosis: PE Patient name and DOB verified: Patient was able to verify her first and last name: Edwin Whitaker, date of birth: 09-Apr-1971.  Patient consented to Telemedicine Evaluation: yes RN virtual assistant: Grayce Grave, RN Video encounter time and date: 05/10/2024 and at approximately: 08:33a  Chief Concern: Abnormal heart rhythm  HPI: Mr. 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    05/09/2024: he presents ED for chief concerns of perceiving irregular heart rhythm.  Vitals at the time of my evaluation showed t of 97.9, rr 17, hr 117, blood pressure 82/67 and improved to 103/86, SpO2 99% on room air.  Serum sodium 141, potassium 3.9, chloride 101, bicarb 28, BUN of 18, serum creatinine 0.99, eGFR greater than 60, nonfasting glucose 121, WBC 7.6, hemoglobin 14.7, platelets of 227.  ED treatment: Heparin  bolus and gtt.  05/10/2024: Patient admitted to [facility] for chief concern of segmental and subsegmental PE in the right lower lobes. ----------------------------------- At bedside via virtual medicine encounter, patient was able to confirm his first and last name, his date of birth.  He reports yesterday evening while he was getting into bed getting ready to go to sleep, he noticed abnormal sensation in his chest consistent with abnormal heart rhythm.  He reports he does have history of PVCs in the past has been able to proceed with this.  He thought he had an episode of PVCs or PACs.  He denies shortness of breath, chest pain, chest pressure,  abdominal pain, nausea, vomiting, leg swelling, dysuria, hematuria, diarrhea.  He denies trauma to his person.  He denies recent surgery.  He denies unintentional weight loss.  He did lose about 10 pounds over the last few months and that was intentional.  Of note, patient had an international flight in August 2025 where he flew directly to [place].  The flight was 3 to 4 hours.  2-3 days later he flew back from [place] to [place] on a direct flight that was 3 to 4 hours as well.  He denies any recent long car trips in the last year.  He denies recent cancer diagnosis.  He has known prior history of blood clots or lung clots.  Social history: He lives at home.  He denies tobacco, recreational drug use.  Patient reports he drinks about 4 carafes of sake per 6-week.  ROS: Constitutional: no weight change, no fever ENT/Mouth: no sore throat, no rhinorrhea Eyes: no eye pain, no vision changes Cardiovascular: no chest pain, no dyspnea,  no edema, + palpitations Respiratory: no cough, no sputum, no wheezing Gastrointestinal: no nausea, no vomiting, no diarrhea, no constipation Genitourinary: no urinary incontinence, no dysuria, no hematuria Musculoskeletal: no arthralgias, no myalgias Skin: no skin lesions, no pruritus, Neuro: no weakness, no loss of consciousness, no syncope Psych: no anxiety, no depression, no decrease appetite Heme/Lymph: no bruising, no bleeding  Assessment/Plan  Principal Problem:   Pulmonary embolism (HCC) Active Problems:   Atrial fibrillation with RVR (HCC)   Deep vein thrombosis (DVT) of right posterior tibial vein (HCC)   Anxiety   Hyperlipidemia   Assessment and Plan:  * Pulmonary embolism (HCC) Continue  heparin  GTT per pharmacy Ultrasound of the lower extremities has been ordered to assess for DVT Complete echo ordered RV/LV ratio on CT was 1.10, a.m. team to consider consultation to vascular  Deep vein thrombosis (DVT) of right posterior  tibial vein (HCC) Continue heparin  GTT  Atrial fibrillation with RVR (HCC) EDP discussed with cardiology who recommends digoxin  500 mcg IV one-time dose in the morning and digoxin  250 mcg IV one-time dose in the evening on day of admission Continue telemetry Complete echo ordered Per EDP, cardiology will see patient once he gets to [facility] bed  Anxiety PDMP reviewed Patient has active prescription for alprazolam  1 mg, quantity of 30, 30 days, written and filled on 04/21/2024 Home alprazolam  1 mg nightly resumed  Hyperlipidemia Atorvastatin  20 mg nightly resumed  Chart reviewed.   DVT prophylaxis: Heparin  GTT Code Status: Full code Diet: Heart healthy Family Communication: A phone call was offered, patient declined stating that he has updated everyone that needs to be updated Disposition Plan: Pending clinical course, complete echo Consults called: Heparin .  Patient may need vascular pending complete echo/clinical course. Admission status: PCU, inpatient  Past Medical History:  Diagnosis Date   A-fib (HCC) 12/07/13   Dysrhythmia    afib   HLD (hyperlipidemia)    PVC's (premature ventricular contractions)    Transient atrial fibrillation or flutter    Past Surgical History:  Procedure Laterality Date   CARDIOVERSION N/A 12/07/2013   Procedure: CARDIOVERSION;  Surgeon: Erick JONELLE Bergamo, MD;  Location: [facility] ENDOSCOPY;  Service: Cardiovascular;  Laterality: N/A;   none     Social History:  reports that he quit smoking about 14 years ago. His smoking use included cigarettes. He started smoking about 37 years ago. He has a 34.5 pack-year smoking history. He has never used smokeless tobacco. He reports current alcohol use. He reports that he does not use drugs.  Allergies[1] Family History  Problem Relation Age of Onset   Atrial fibrillation Father    Allergies Father    Breast cancer Sister    Family history: Family history reviewed and pertinent for atrial fibrillation in his  father.  Prior to Admission medications  Medication Sig Start Date End Date Taking? Authorizing Provider  ALPRAZolam  (XANAX ) 0.5 MG tablet Take 0.5 mg by mouth at bedtime.    [provider]  aspirin  81 MG tablet Take 81 mg by mouth daily.    [provider]  atorvastatin  (LIPITOR) 20 MG tablet Take 20 mg by mouth daily.    [provider]  clobetasol  (TEMOVATE ) 0.05 % external solution Apply 1 application topically 2 (two) times daily. 07/03/21   Tafeen, Stuart, MD  ketoconazole (NIZORAL) 2 % cream Apply 1 application topically daily as needed for irritation (for face).    [provider]  propranolol  (INDERAL ) 10 MG tablet Take 1 tablet (10 mg total) by mouth 3 (three) times daily. 12/07/13   Bergamo Heinz, MD  Ranitidine HCl (ZANTAC PO) Take 1 tablet by mouth daily as needed (heatburn).    [provider]  tacrolimus  (PROTOPIC ) 0.1 % ointment Apply topically at bedtime. 07/03/21   Tafeen, Stuart, MD  valACYclovir (VALTREX) 500 MG tablet Take 500 mg by mouth daily.    [provider]    Physical Exam completed with assistance of: Grayce Grave, RN, who was at bedside during this portion of the virtual encounter:  Vitals:   05/10/24 1128 05/10/24 1130 05/10/24 1215 05/10/24 1245  BP:  104/67  100/74  Pulse: ROLLEN)  110 (!) 32 69 (!) 128  Resp: 12 19 16 15   Temp:    98.7 F (37.1 C)  TempSrc:    Oral  SpO2: 100% 99% 100% 94%  Weight:      Height:       Constitutional: appears age-appropriate, NAD, calm Eyes: EOMI,  conjunctivae normal ENMT: Mucous membranes are moist. Hearing appropriate Neck: normal, supple, no masses, no thyromegaly Respiratory: clear to auscultation bilaterally, no wheezing. Normal respiratory effort. No accessory muscle use.  Cardiovascular: irregular rate and irregular rhythm, no murmurs. No extremity edema. Abdomen: no tenderness. Bowel sounds positive.  Musculoskeletal: No joint deformity upper and lower  extremities. Good ROM, no contractures, no atrophy. Skin: no rashes, ulcers on visible skin Neurologic: Strength is appropriate upper extremities.  Psychiatric: Normal judgment and insight. Alert and oriented x 3. Normal mood.   EKG: independently reviewed, showing atrial fibrillation with rate of 109, QTc 426  Chest x-ray on Admission: I personally reviewed and I agree with radiologist reading as below.  US  Venous Img Lower Bilateral Result Date: 05/10/2024 CLINICAL DATA:  Pulmonary embolism.  Evaluate for DVT. EXAM: BILATERAL LOWER EXTREMITY VENOUS DOPPLER ULTRASOUND TECHNIQUE: Gray-scale sonography with graded compression, as well as color Doppler and duplex ultrasound were performed to evaluate the lower extremity deep venous systems from the level of the common femoral vein and including the common femoral, femoral, profunda femoral, popliteal and calf veins including the posterior tibial, peroneal and gastrocnemius veins when visible. The superficial great saphenous vein was also interrogated. Spectral Doppler was utilized to evaluate flow at rest and with distal augmentation maneuvers in the common femoral, femoral and popliteal veins. COMPARISON:  None Available. FINDINGS: RIGHT LOWER EXTREMITY Common Femoral Vein: No evidence of thrombus. Normal compressibility, respiratory phasicity and response to augmentation. Saphenofemoral Junction: No evidence of thrombus. Normal compressibility and flow on color Doppler imaging. Profunda Femoral Vein: No evidence of thrombus. Normal compressibility and flow on color Doppler imaging. Femoral Vein: No evidence of thrombus. Normal compressibility, respiratory phasicity and response to augmentation. Popliteal Vein: No evidence of thrombus. Normal compressibility, respiratory phasicity and response to augmentation. Calf Veins: Near occlusive thrombus in the right posterior tibial vein. Superficial Great Saphenous Vein: No evidence of thrombus. Normal  compressibility. Venous Reflux:  None. Other Findings:  None. LEFT LOWER EXTREMITY Common Femoral Vein: No evidence of thrombus. Normal compressibility, respiratory phasicity and response to augmentation. Saphenofemoral Junction: No evidence of thrombus. Normal compressibility and flow on color Doppler imaging. Profunda Femoral Vein: No evidence of thrombus. Normal compressibility and flow on color Doppler imaging. Femoral Vein: No evidence of thrombus. Normal compressibility, respiratory phasicity and response to augmentation. Popliteal Vein: No evidence of thrombus. Normal compressibility, respiratory phasicity and response to augmentation. Calf Veins: No evidence of thrombus. Normal compressibility and flow on color Doppler imaging. Superficial Great Saphenous Vein: No evidence of thrombus. Normal compressibility. Venous Reflux:  None. Other Findings:  None. IMPRESSION: Positive for DVT in the right posterior tibial vein. Electronically Signed   By: Vanetta Chou M.D.   On: 05/10/2024 11:29   CT Angio Chest PE W and/or Wo Contrast Result Date: 05/10/2024 EXAM: CTA CHEST 05/10/2024 06:25:00 AM TECHNIQUE: CTA of the chest was performed without and with the administration of 75 mL of intravenous iohexol  (OMNIPAQUE ) 350 MG/ML injection. Multiplanar reformatted images are provided for review. MIP images are provided for review. Automated exposure control, iterative reconstruction, and/or weight based adjustment of the mA/kV was utilized to reduce the radiation dose to as  low as reasonably achievable. COMPARISON: None available. CLINICAL HISTORY: Syncope/presyncope, cerebrovascular cause suspected. FINDINGS: PULMONARY ARTERIES: Pulmonary arteries are adequately opacified for evaluation. Filling defect within a segmental branch and subsegmental branches of the right lower lobe pulmonary artery is noted posteriorly, axial image 78/6 and axial image 76/6. Main pulmonary artery is normal in caliber. RV to LV ratio  is equal to 1.1. MEDIASTINUM: Normal heart size. No pericardial effusion. There is no acute abnormality of the thoracic aorta. LYMPH NODES: No mediastinal, hilar or axillary lymphadenopathy. LUNGS AND PLEURA: Mild emphysema with diffuse bronchial wall thickening. Dependent ground-glass attenuation identified within the posterior lung bases. Areas of atelectasis or scar noted within the imaged portions of the lung bases including both lower lobes, lingular, and right middle lobe. Calcified granuloma identified within the superior segment of the right lower lobe. No pneumothorax or consolidative change. No evidence of pleural effusion. UPPER ABDOMEN: Limited images of the upper abdomen are unremarkable. SOFT TISSUES AND BONES: No acute bone or soft tissue abnormality. IMPRESSION: 1. Pulmonary embolism involving a segmental branch and subsegmental branches of the right lower lobe pulmonary artery. 2. Emphysema. 3. Critical results were called to the ordering provider at the time of interpretation on 05/10/2024. I personally spoke with Dr. Palumbo, who acknowledged these findings. Electronically signed by: Waddell Calk MD 05/10/2024 06:40 AM EST RP Workstation: [id number]   DG Chest Portable 1 View Result Date: 05/10/2024 EXAM: 1 VIEW(S) XRAY OF THE CHEST 05/10/2024 01:44:00 AM COMPARISON: None available. CLINICAL HISTORY: painetvc FINDINGS: LUNGS AND PLEURA: Bibasilar atelectasis. No pleural effusion. No pneumothorax. HEART AND MEDIASTINUM: No acute abnormality of the cardiac and mediastinal silhouettes. BONES AND SOFT TISSUES: No acute osseous abnormality. IMPRESSION: 1. No acute cardiopulmonary abnormality. 2. Mild bibasilar atelectasis. Electronically signed by: Oneil Devonshire MD 05/10/2024 01:51 AM EST RP Workstation: [id number]   Labs on Admission: I have personally reviewed following labs  CBC: Recent Labs  Lab 05/10/24 0130  WBC 7.6  NEUTROABS 3.5  HGB 14.7  HCT 43.8  MCV 90.3  PLT 227   Basic  Metabolic Panel: Recent Labs  Lab 05/10/24 0130  NA 141  K 3.9  CL 101  CO2 28  GLUCOSE 121*  BUN 18  CREATININE 0.99  CALCIUM  9.3   GFR: Estimated Creatinine Clearance: 102.9 mL/min (by C-G formula based on SCr of 0.99 mg/dL).  This document was prepared using Dragon Voice Recognition software and may include unintentional dictation errors.  Dr. Sherre [facility] Location: [place]  If 7PM-7AM, please contact overnight-coverage provider If 7AM-7PM, please contact day attending provider [email or web address]  05/10/2024, 12:55 PM      [1] No Known Allergies  "

## 2024-05-10 NOTE — ED Provider Notes (Signed)
 Patient is here boarding, waiting for a bed at [facility].  In brief he has a remote history of atrial fibrillation about 10 to 15 years ago.  Not currently on anticoagulants.  Per prior ED PE, came in with palpitations and was started on diltiazem .  His blood pressure has been soft since the diltiazem .  His heart rate has gone back up to about the 130s and 140s.  Discussed with Dr. Michele with cardiology who recommends adding a UDS and EtOH level which was done.  Also recommends having patient 500 mcg of digoxin  IV and then 6 hours later give 250 mcg.  They will put patient on the consult list to see once he is over at [facility].  CRITICAL CARE Performed by: Andrea Ness Total critical care time: 30 minutes Critical care time was exclusive of separately billable procedures and treating other patients. Critical care was necessary to treat or prevent imminent or life-threatening deterioration. Critical care was time spent personally by me on the following activities: development of treatment plan with patient and/or surrogate as well as nursing, discussions with consultants, evaluation of patient's response to treatment, examination of patient, obtaining history from patient or surrogate, ordering and performing treatments and interventions, ordering and review of laboratory studies, ordering and review of radiographic studies, pulse oximetry and re-evaluation of patient's condition.    Ness Andrea, MD 05/12/24 [id number]

## 2024-05-10 NOTE — ED Notes (Signed)
 EDP notified of continued hypotension despite discontinuation of Diltiazem  drip. V/o given for 500 mL NS bolus.

## 2024-05-10 NOTE — ED Notes (Signed)
 [facility] is enroute to transport

## 2024-05-10 NOTE — Plan of Care (Signed)

## 2024-05-10 NOTE — Assessment & Plan Note (Signed)
-   Atorvastatin 20 mg nightly resumed 

## 2024-05-10 NOTE — Progress Notes (Addendum)
 Hospitalist Transfer Note:    Nursing staff, Please call [facility] Admits & Consults System-Wide number on Amion [phone]) as soon as patient's arrival, so appropriate admitting provider can evaluate the pt.   Transferring facility: [facility] Requesting provider: Dr. Palumbo (EDP at [facility]) Reason for transfer: admission for further evaluation and management of atrial fibrillation with RVR.    53 year old male with history of paroxysmal atrial fibrillation, who presented to [facility] ED complaining of new onset palpitations starting earlier this evening after consuming 2 beers, with patient noting that he typically does not consume any alcohol.  Not associate with any chest pain, shortness of breath.   The patient reports a history of paroxysmal atrial fibrillation, but conveys that he is not currently on any AV nodal blocking agents or chronic anticoagulation for this.  For brief chart review, most recent echocardiogram on file appears to have occurred as component of exercise stress test in December 2013.   Vital signs in the ED were notable for the following: Patient presented in atrial fibrillation with RVR, with initial sustained heart rates in the 160s.  He was subsequent started on diltiazem  drip, with interval improvement in heart rates into the low 100s.  Systolic blood pressures have remained in the low 100s to 120s mmHg. additional vital signs notable for afebrile, respiratory rate 15-22, and oxygen saturation 93 to 97% on room air.  Labs were notable for BMP, which showed potassium level of 3.9.  High sensitive troponin T x 1 value was found to be less than 15.   Imaging notable for 1 view chest x-ray, which showed mild bibasilar atelectasis, but otherwise no evidence of acute cardiopulmonary process.  Medications administered prior to transfer included the following: diltiazem  drip, 2 liters of IVF's.   Subsequently, I accepted this patient for transfer for observation to a pcu bed at [facility]  for further work-up and management of the above.    UPDATE: ensuing CTA chest shows evidence of segmental and subsegmental pumonary emboli, without CT e/o right heart strain. I've asked that we start pt on heparin  drip and hold additional eliquis .      Eva Pore, DO Hospitalist

## 2024-05-10 NOTE — ED Provider Notes (Addendum)
 " [facility] Provider Note   CSN: [id number] Arrival date & time: 05/10/24  0116     Patient presents with: Tachycardia   Edwin Whitaker is a 53 y.o. male.   The history is provided by the patient.  Palpitations Palpitations quality:  Fast Onset quality:  Sudden Timing:  Constant Progression:  Unchanged Chronicity:  Recurrent Context: not appetite suppressants, not blood loss, not dehydration, not illicit drugs and not stimulant use   Relieved by:  Nothing Exacerbated by: had 2 12 ounce beers. Ineffective treatments:  None tried Associated symptoms: no chest pain, no leg pain and no numbness   Risk factors: no hx of PE   Patient with h/o PAF presents with Palpitations.  Was eating gummie bears at rest this evening.  No trauma.  No exertional symptoms.       Prior to Admission medications  Medication Sig Start Date End Date Taking? Authorizing Provider  ALPRAZolam  (XANAX ) 0.5 MG tablet Take 0.5 mg by mouth at bedtime.    [provider]  aspirin  81 MG tablet Take 81 mg by mouth daily.    [provider]  atorvastatin  (LIPITOR) 20 MG tablet Take 20 mg by mouth daily.    [provider]  clobetasol  (TEMOVATE ) 0.05 % external solution Apply 1 application topically 2 (two) times daily. 07/03/21   Tafeen, Stuart, MD  ketoconazole (NIZORAL) 2 % cream Apply 1 application topically daily as needed for irritation (for face).    [provider]  propranolol  (INDERAL ) 10 MG tablet Take 1 tablet (10 mg total) by mouth 3 (three) times daily. 12/07/13   Ladona Heinz, MD  Ranitidine HCl (ZANTAC PO) Take 1 tablet by mouth daily as needed (heatburn).    [provider]  tacrolimus  (PROTOPIC ) 0.1 % ointment Apply topically at bedtime. 07/03/21   Tafeen, Stuart, MD  valACYclovir (VALTREX) 500 MG tablet Take 500 mg by mouth daily.    [provider]    Allergies: Patient has no known allergies.     Review of Systems  Constitutional:  Negative for fever.  Cardiovascular:  Positive for palpitations. Negative for chest pain and leg swelling.  Neurological:  Negative for numbness.  All other systems reviewed and are negative.   Updated Vital Signs BP (!) 84/69   Pulse (!) 25   Temp 97.7 F (36.5 C) (Oral)   Resp 16   Ht 6' 0.5 (1.842 m)   Wt 97.5 kg   SpO2 94%   BMI 28.76 kg/m   Physical Exam Vitals and nursing note reviewed.  Constitutional:      General: He is not in acute distress.    Appearance: Normal appearance. He is well-developed. He is not diaphoretic.  HENT:     Head: Normocephalic and atraumatic.     Nose: Nose normal.  Eyes:     Conjunctiva/sclera: Conjunctivae normal.     Pupils: Pupils are equal, round, and reactive to light.  Cardiovascular:     Rate and Rhythm: Tachycardia present. Rhythm irregular.     Pulses: Normal pulses.     Heart sounds: Normal heart sounds.  Pulmonary:     Effort: Pulmonary effort is normal.     Breath sounds: Normal breath sounds. No wheezing or rales.  Abdominal:     General: Bowel sounds are normal.     Palpations: Abdomen is soft.     Tenderness: There is no abdominal tenderness. There is no guarding or rebound.  Musculoskeletal:        General: Normal range of motion.     Cervical back: Normal range of motion and neck supple.  Skin:    General: Skin is warm and dry.     Capillary Refill: Capillary refill takes less than 2 seconds.  Neurological:     General: No focal deficit present.     Mental Status: He is alert and oriented to person, place, and time.  Psychiatric:        Mood and Affect: Mood normal.     (all labs ordered are listed, but only abnormal results are displayed) Results for orders placed or performed during the hospital encounter of 05/10/24  CBC with Differential   Collection Time: 05/10/24  1:30 AM  Result Value Ref Range   WBC 7.6 4.0 - 10.5 K/uL   RBC 4.85 4.22 - 5.81 MIL/uL    Hemoglobin 14.7 13.0 - 17.0 g/dL   HCT 56.1 60.9 - 47.9 %   MCV 90.3 80.0 - 100.0 fL   MCH 30.3 26.0 - 34.0 pg   MCHC 33.6 30.0 - 36.0 g/dL   RDW 87.0 88.4 - 84.4 %   Platelets 227 150 - 400 K/uL   nRBC 0.0 0.0 - 0.2 %   Neutrophils Relative % 47 %   Neutro Abs 3.5 1.7 - 7.7 K/uL   Lymphocytes Relative 42 %   Lymphs Abs 3.2 0.7 - 4.0 K/uL   Monocytes Relative 8 %   Monocytes Absolute 0.6 0.1 - 1.0 K/uL   Eosinophils Relative 2 %   Eosinophils Absolute 0.2 0.0 - 0.5 K/uL   Basophils Relative 1 %   Basophils Absolute 0.0 0.0 - 0.1 K/uL   Immature Granulocytes 0 %   Abs Immature Granulocytes 0.01 0.00 - 0.07 K/uL  Basic metabolic panel   Collection Time: 05/10/24  1:30 AM  Result Value Ref Range   Sodium 141 135 - 145 mmol/L   Potassium 3.9 3.5 - 5.1 mmol/L   Chloride 101 98 - 111 mmol/L   CO2 28 22 - 32 mmol/L   Glucose, Bld 121 (H) 70 - 99 mg/dL   BUN 18 6 - 20 mg/dL   Creatinine, Ser 9.00 0.61 - 1.24 mg/dL   Calcium  9.3 8.9 - 10.3 mg/dL   GFR, Estimated >39 >39 mL/min   Anion gap 12 5 - 15  Troponin T, High Sensitivity   Collection Time: 05/10/24  1:30 AM  Result Value Ref Range   Troponin T High Sensitivity <15 0 - 19 ng/L   DG Chest Portable 1 View Result Date: 05/10/2024 EXAM: 1 VIEW(S) XRAY OF THE CHEST 05/10/2024 01:44:00 AM COMPARISON: None available. CLINICAL HISTORY: painetvc FINDINGS: LUNGS AND PLEURA: Bibasilar atelectasis. No pleural effusion. No pneumothorax. HEART AND MEDIASTINUM: No acute abnormality of the cardiac and mediastinal silhouettes. BONES AND SOFT TISSUES: No acute osseous abnormality. IMPRESSION: 1. No acute cardiopulmonary abnormality. 2. Mild bibasilar atelectasis. Electronically signed by: Oneil Devonshire MD 05/10/2024 01:51 AM EST RP Workstation: [id number]    EKG: EKG Interpretation Date/Time:  Sunday May 10 2024 04:58:50 EST Ventricular Rate:  109 PR Interval:    QRS Duration:  85 QT Interval:  316 QTC Calculation: 426 R  Axis:   6  Text Interpretation: Atrial fibrillation Confirmed by Geneva Pallas ([id number]) on 05/10/2024 5:04:15 AM  Radiology: [facility] Chest Portable 1 View Result Date: 05/10/2024 EXAM: 1 VIEW(S) XRAY OF THE CHEST 05/10/2024 01:44:00 AM COMPARISON: None available. CLINICAL HISTORY: painetvc FINDINGS: LUNGS AND PLEURA:  Bibasilar atelectasis. No pleural effusion. No pneumothorax. HEART AND MEDIASTINUM: No acute abnormality of the cardiac and mediastinal silhouettes. BONES AND SOFT TISSUES: No acute osseous abnormality. IMPRESSION: 1. No acute cardiopulmonary abnormality. 2. Mild bibasilar atelectasis. Electronically signed by: Oneil Devonshire MD 05/10/2024 01:51 AM EST RP Workstation: [id number]     .Critical Care  Performed by: Nettie Earing, MD Authorized by: Nettie Earing, MD   Critical care provider statement:    Critical care time (minutes):  30   Critical care end time:  05/10/2024 5:43 AM   Critical care was necessary to treat or prevent imminent or life-threatening deterioration of the following conditions:  Cardiac failure   Critical care was time spent personally by me on the following activities:  Development of treatment plan with patient or surrogate, discussions with consultants, evaluation of patient's response to treatment, examination of patient, ordering and review of laboratory studies, ordering and review of radiographic studies, ordering and performing treatments and interventions, pulse oximetry, re-evaluation of patient's condition and review of old charts   I assumed direction of critical care for this patient from another provider in my specialty: no     Care discussed with: admitting provider      Medications Ordered in the ED  diltiazem  (CARDIZEM ) 1 mg/mL load via infusion 10 mg (10 mg Intravenous Bolus from Bag 05/10/24 0156)    And  diltiazem  (CARDIZEM ) 125 mg in dextrose  5% 125 mL (1 mg/mL) infusion (0 mg/hr Intravenous Stopped 05/10/24 0425)  apixaban  (ELIQUIS ) tablet  5 mg (5 mg Oral Given 05/10/24 9786)  sodium chloride  0.9 % bolus 1,000 mL ( Intravenous Stopped 05/10/24 0315)  sodium chloride  0.9 % bolus 1,000 mL ( Intravenous Stopped 05/10/24 0415)  sodium chloride  0.9 % bolus 500 mL (500 mLs Intravenous New Bag/Given 05/10/24 0539)                                    Medical Decision Making Patient with PAF with fast HR  Amount and/or Complexity of Data Reviewed Independent Historian: spouse    Details: See above  External Data Reviewed: notes.    Details: Previous notes  Labs: ordered.    Details: Troponin < 15. Normal sodium 141, normal potassium 3.9, normal creatinine.  Normal white count 7.6,  normal hemoglobin 14.7  Radiology: ordered and independent interpretation performed. Decision-making details documented in ED Course.    Details: Normal CXR ECG/medicine tests: ordered and independent interpretation performed. Decision-making details documented in ED Course.  Risk Prescription drug management. Decision regarding hospitalization. Risk Details: Initially ordered heparin  drip but based on CHAD VASC score pharmacy recommended Eliquis ,  but with PE plan is to change back to heparine      Final diagnoses:  Atrial fibrillation with rapid ventricular response (HCC)   The patient appears reasonably stabilized for admission considering the current resources, flow, and capabilities available in the ED at this time, and I doubt any other [facility] requiring further screening and/or treatment in the ED prior to admission.      Artis Buechele, MD 05/10/24 [id number]  "

## 2024-05-10 NOTE — ED Triage Notes (Signed)
 Pt with 30 min of fast heart rate. Pt states apple watch notified him of fast heart rate and Afib. Hx of same. Not on blood thinners. C/o some SHOB with exertion, otherwise no other sx.

## 2024-05-10 NOTE — Assessment & Plan Note (Addendum)
 PDMP reviewed Patient has active prescription for alprazolam  1 mg, quantity of 30, 30 days, written and filled on 04/21/2024 Home alprazolam  1 mg nightly resumed

## 2024-05-10 NOTE — Progress Notes (Signed)
 Brief note: -Patient seen alongside patient's wife.   - Also discussed with the cardiology team.   - Patient was admitted earlier today via telemedicine.  Essentially, patient is a 53 year old male with no history of intermittent atrial fibrillation for over 10 years.  Other medical history includes hyperlipidemia and PVCs.  Patient denies history of significant alcohol use.  No TSH is visualized.  Toxicology is negative.  Patient presented to the emergency room with atrial fibrillation with rapid ventricular response.  During the workup, patient was also found to have pulmonary embolism and DVT.  Patient is currently on heparin  drip.  Cardiology team is directing rate control medications.  No shortness of breath, chest pain or other constitutional symptoms endorsed.  On examination: General: Patient is awake and alert.  Not in any distress. HEENT: No pallor.  No jaundice. Neck: Supple. CVS: S1-S2, irregularly irregular. Lungs: Clear to auscultation. Abdomen: Soft and nontender. Neuro: Awake and alert.  Moves all extremities. Extremities: No leg edema.  Continue current management. Cardiology input is appreciated. Check TSH.  Further management will depend on hospital course.

## 2024-05-10 NOTE — ED Notes (Signed)
 Pt and family updated on bed assignment.

## 2024-05-11 ENCOUNTER — Telehealth (HOSPITAL_COMMUNITY): Payer: Self-pay

## 2024-05-11 ENCOUNTER — Other Ambulatory Visit (HOSPITAL_COMMUNITY): Payer: Self-pay

## 2024-05-11 ENCOUNTER — Inpatient Hospital Stay (HOSPITAL_COMMUNITY)

## 2024-05-11 DIAGNOSIS — R012 Other cardiac sounds: Secondary | ICD-10-CM

## 2024-05-11 DIAGNOSIS — I2699 Other pulmonary embolism without acute cor pulmonale: Secondary | ICD-10-CM

## 2024-05-11 DIAGNOSIS — I4891 Unspecified atrial fibrillation: Secondary | ICD-10-CM

## 2024-05-11 LAB — CBC
HCT: 45.3 % (ref 39.0–52.0)
Hemoglobin: 15.4 g/dL (ref 13.0–17.0)
MCH: 31 pg (ref 26.0–34.0)
MCHC: 34 g/dL (ref 30.0–36.0)
MCV: 91.1 fL (ref 80.0–100.0)
Platelets: 251 K/uL (ref 150–400)
RBC: 4.97 MIL/uL (ref 4.22–5.81)
RDW: 13.1 % (ref 11.5–15.5)
WBC: 7 K/uL (ref 4.0–10.5)
nRBC: 0 % (ref 0.0–0.2)

## 2024-05-11 LAB — ECHOCARDIOGRAM COMPLETE
AR max vel: 2.98 cm2
AV Area VTI: 2.7 cm2
AV Area mean vel: 2.85 cm2
AV Mean grad: 3 mmHg
AV Peak grad: 5.3 mmHg
Ao pk vel: 1.15 m/s
Area-P 1/2: 4.49 cm2
Height: 72 in
S' Lateral: 3.3 cm
Weight: 3500.8 [oz_av]

## 2024-05-11 LAB — BASIC METABOLIC PANEL WITH GFR
Anion gap: 10 (ref 5–15)
BUN: 15 mg/dL (ref 6–20)
CO2: 25 mmol/L (ref 22–32)
Calcium: 8.3 mg/dL — ABNORMAL LOW (ref 8.9–10.3)
Chloride: 105 mmol/L (ref 98–111)
Creatinine, Ser: 0.95 mg/dL (ref 0.61–1.24)
GFR, Estimated: >60 mL/min
Glucose, Bld: 117 mg/dL — ABNORMAL HIGH (ref 70–99)
Potassium: 4.3 mmol/L (ref 3.5–5.1)
Sodium: 139 mmol/L (ref 135–145)

## 2024-05-11 MED ORDER — METOPROLOL TARTRATE 25 MG PO TABS: 25.0000 mg | ORAL_TABLET | Freq: Two times a day (BID) | ORAL | Status: DC

## 2024-05-11 MED ORDER — AMIODARONE HCL IN DEXTROSE 360-4.14 MG/200ML-% IV SOLN
30.0000 mg/h | INTRAVENOUS | Status: DC
  Administered 2024-05-11 – 2024-05-12 (×2): 30 mg/h via INTRAVENOUS
  Filled 2024-05-11: qty 200

## 2024-05-11 MED ORDER — METOPROLOL TARTRATE 25 MG PO TABS
25.0000 mg | ORAL_TABLET | Freq: Two times a day (BID) | ORAL | Status: DC
  Administered 2024-05-11 – 2024-05-13 (×5): 25 mg via ORAL
  Filled 2024-05-11 (×5): qty 1

## 2024-05-11 MED ORDER — AMIODARONE HCL IN DEXTROSE 360-4.14 MG/200ML-% IV SOLN
60.0000 mg/h | INTRAVENOUS | Status: AC
  Administered 2024-05-11: 60 mg/h via INTRAVENOUS
  Filled 2024-05-11 (×2): qty 200

## 2024-05-11 NOTE — Progress Notes (Signed)
 " Cardiology Consultation:   Patient ID: LOTHAR PREHN MRN: [id number]; DOB: Dec 13, 1970  Admit date: 05/10/2024 Date of Consult: 05/11/2024  Primary Care Provider: Delice Charleston, MD (Inactive) [facility] Cardiologist: None  [facility] Electrophysiologist: Adina Primus, MD  Patient Profile:   MABEL UNREIN is a 53 y.o. 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   Interval Events:   AF/VR 100s overnight however with RVR again this morning when he woke up. Had lopressor  25 mg PO at 1814 yesterday and 0020. Dose held this morning with asx hypotension.   Past Medical History:  Diagnosis Date   A-fib (HCC) 12/07/13   Dysrhythmia    afib   HLD (hyperlipidemia)    PVC's (premature ventricular contractions)    Transient atrial fibrillation or flutter    Past Surgical History:  Procedure Laterality Date   CARDIOVERSION N/A 12/07/2013   Procedure: CARDIOVERSION;  Surgeon: Erick JONELLE Bergamo, MD;  Location: [facility] ENDOSCOPY;  Service: Cardiovascular;  Laterality: N/A;   none     Inpatient Medications: Scheduled Meds:  ALPRAZolam   1 mg Oral QHS   atorvastatin   20 mg Oral QHS   metoprolol  tartrate  25 mg Oral Q6H   Rivaroxaban   15 mg Oral BID WC   Followed by   NOREEN ON 05/31/2024] rivaroxaban   20 mg Oral Q supper   Continuous Infusions:  amiodarone      amiodarone      PRN Meds: acetaminophen  **OR** acetaminophen , ondansetron  **OR** ondansetron  (ZOFRAN ) IV, senna-docusate  Allergies:   Allergies[1]  Social History:   Social History   Socioeconomic History   Marital status: Legally Separated    Spouse name: Luke   Number of children: 3   Years of education: 16   Highest education level: Not on file  Occupational History   Occupation: Acupuncturist: [employer]  Tobacco Use   Smoking status: Former    Current packs/day: 0.00    Average  packs/day: 1.5 packs/day for 23.0 years (34.5 ttl pk-yrs)    Types: Cigarettes    Start date: 10/09/1986    Quit date: 10/08/2009    Years since quitting: 14.6   Smokeless tobacco: Never  Vaping Use   Vaping status: Every Day  Substance and Sexual Activity   Alcohol use: Yes    Comment: per night    Drug use: No   Sexual activity: Yes    Partners: Female  Other Topics Concern   Not on file  Social History Narrative   Patient is right handed and resides with wife and children   Social Drivers of Health   Tobacco Use: Medium Risk (05/10/2024)   Patient History    Smoking Tobacco Use: Former    Smokeless Tobacco Use: Never    Passive Exposure: Not on Actuary Strain: Not on file  Food Insecurity: No Food Insecurity (05/10/2024)   Epic    Worried About Programme Researcher, Broadcasting/film/video in the Last Year: Never true    Ran Out of Food in the Last Year: Never true  Transportation Needs: No Transportation Needs (05/10/2024)   Epic    Lack of Transportation (Medical): No    Lack of Transportation (Non-Medical): No  Physical Activity: Not on file  Stress: Not on file  Social Connections: Unknown (05/10/2024)   Social Connection and Isolation Panel    Frequency of  Communication with Friends and Family: Twice a week    Frequency of Social Gatherings with Friends and Family: Twice a week    Attends Religious Services: Never    Database Administrator or Organizations: No    Attends Banker Meetings: Never    Marital Status: Not on file  Intimate Partner Violence: Not At Risk (05/10/2024)   Epic    Fear of Current or Ex-Partner: No    Emotionally Abused: No    Physically Abused: No    Sexually Abused: No  Depression (PHQ2-9): Not on file  Alcohol Screen: Not on file  Housing: Low Risk (05/10/2024)   Epic    Unable to Pay for Housing in the Last Year: No    Number of Times Moved in the Last Year: 0    Homeless in the Last Year: No  Utilities: Not At Risk  (05/10/2024)   Epic    Threatened with loss of utilities: No  Health Literacy: Not on file    Family History:    Family History  Problem Relation Age of Onset   Atrial fibrillation Father    Allergies Father    Breast cancer Sister     ROS:  Review of Systems: [y] = yes, [ ]  = no      General: Weight gain [ ] ; Weight loss [ ] ; Anorexia [ ] ; Fatigue [ ] ; Fever [ ] ; Chills [ ] ; Weakness [ ]    Cardiac: Chest pain/pressure [ ] ; Resting SOB [ ] ; Exertional SOB [ ] ; Orthopnea [ ] ; Pedal Edema [ ] ; Palpitations [ ] ; Syncope [ ] ; Presyncope [ ] ; Paroxysmal nocturnal dyspnea [ ]    Pulmonary: Cough [ ] ; Wheezing [ ] ; Hemoptysis [ ] ; Sputum [ ] ; Snoring [ ]    GI: Vomiting [ ] ; Dysphagia [ ] ; Melena [ ] ; Hematochezia [ ] ; Heartburn [ ] ; Abdominal pain [ ] ; Constipation [ ] ; Diarrhea [ ] ; BRBPR [ ]    GU: Hematuria [ ] ; Dysuria [ ] ; Nocturia [ ]  Vascular: Pain in legs with walking [ ] ; Pain in feet with lying flat [ ] ; Non-healing sores [ ] ; Stroke [ ] ; TIA [ ] ; Slurred speech [ ] ;   Neuro: Headaches [ ] ; Vertigo [ ] ; Seizures [ ] ; Paresthesias [ ] ;Blurred vision [ ] ; Diplopia [ ] ; Vision changes [ ]    Ortho/Skin: Arthritis [ ] ; Joint pain [ ] ; Muscle pain [ ] ; Joint swelling [ ] ; Back Pain [ ] ; Rash [ ]    Psych: Depression [ ] ; Anxiety [ ]    Heme: Bleeding problems [ ] ; Clotting disorders [ ] ; Anemia [ ]    Endocrine: Diabetes [ ] ; Thyroid dysfunction [ ]    Physical Exam/Data:   Vitals:   05/10/24 2351 05/11/24 0240 05/11/24 0244 05/11/24 0413  BP: (!) 89/69 (!) 74/56 94/72 (!) 87/66  Pulse: 100 85 94 86  Resp: 18   18  Temp: 98 F (36.7 C)   98 F (36.7 C)  TempSrc: Oral   Oral  SpO2: 93% 96% 95% 95%  Weight:      Height:       Intake/Output Summary (Last 24 hours) at 05/11/2024 0609 Last data filed at 05/10/2024 2222 Gross per 24 hour  Intake 1728.76 ml  Output --  Net 1728.76 ml      05/10/2024    3:23 PM 05/10/2024    1:32 AM 12/30/2013    8:21 AM  Last 3 Weights  Weight  (lbs) 218 lb 12.8 oz  215 lb 213 lb  Weight (kg) 99.247 kg 97.523 kg 96.616 kg     Body mass index is 29.67 kg/m.  General:  Well nourished, well developed, in no acute distress HEENT: normal Neck: no JVD Vascular: No carotid bruits; FA pulses 2+ bilaterally without bruits  Cardiac:  irregular rhythm, accelerated rate, no M/G/R Lungs:  clear to auscultation bilaterally, no wheezing, rhonchi or rales  Abd: soft, nontender, no hepatomegaly  Ext: no edema Musculoskeletal:  No deformities, BUE and BLE strength normal and equal Skin: warm and dry  Neuro:  CNs 2-12 intact, no focal abnormalities noted Psych:  Normal affect   Telemetry:  Telemetry was personally reviewed and demonstrates: AF/VR 100s overnight, AF/RVR this morning   Relevant CV Studies: TTE pending   CTPE Result date: 05/10/24 1. Pulmonary embolism involving a segmental branch and subsegmental branches of the right lower lobe pulmonary artery. 2. Emphysema. 3. Critical results were called to the ordering provider at the time of interpretation on 05/10/2024. I personally spoke with Dr. Palumbo, who acknowledged these findings.  Laboratory Data:  Chemistry Recent Labs  Lab 05/10/24 0130  NA 141  K 3.9  CL 101  CO2 28  GLUCOSE 121*  BUN 18  CREATININE 0.99  CALCIUM  9.3  GFRNONAA >60  ANIONGAP 12    Hematology Recent Labs  Lab 05/10/24 0130  WBC 7.6  RBC 4.85  HGB 14.7  HCT 43.8  MCV 90.3  MCH 30.3  MCHC 33.6  RDW 12.9  PLT 227   Radiology/Studies:  US  Venous Img Lower Bilateral Result Date: 05/10/2024 CLINICAL DATA:  Pulmonary embolism.  Evaluate for DVT. EXAM: BILATERAL LOWER EXTREMITY VENOUS DOPPLER ULTRASOUND TECHNIQUE: Gray-scale sonography with graded compression, as well as color Doppler and duplex ultrasound were performed to evaluate the lower extremity deep venous systems from the level of the common femoral vein and including the common femoral, femoral, profunda femoral, popliteal and  calf veins including the posterior tibial, peroneal and gastrocnemius veins when visible. The superficial great saphenous vein was also interrogated. Spectral Doppler was utilized to evaluate flow at rest and with distal augmentation maneuvers in the common femoral, femoral and popliteal veins. COMPARISON:  None Available. FINDINGS: RIGHT LOWER EXTREMITY Common Femoral Vein: No evidence of thrombus. Normal compressibility, respiratory phasicity and response to augmentation. Saphenofemoral Junction: No evidence of thrombus. Normal compressibility and flow on color Doppler imaging. Profunda Femoral Vein: No evidence of thrombus. Normal compressibility and flow on color Doppler imaging. Femoral Vein: No evidence of thrombus. Normal compressibility, respiratory phasicity and response to augmentation. Popliteal Vein: No evidence of thrombus. Normal compressibility, respiratory phasicity and response to augmentation. Calf Veins: Near occlusive thrombus in the right posterior tibial vein. Superficial Great Saphenous Vein: No evidence of thrombus. Normal compressibility. Venous Reflux:  None. Other Findings:  None. LEFT LOWER EXTREMITY Common Femoral Vein: No evidence of thrombus. Normal compressibility, respiratory phasicity and response to augmentation. Saphenofemoral Junction: No evidence of thrombus. Normal compressibility and flow on color Doppler imaging. Profunda Femoral Vein: No evidence of thrombus. Normal compressibility and flow on color Doppler imaging. Femoral Vein: No evidence of thrombus. Normal compressibility, respiratory phasicity and response to augmentation. Popliteal Vein: No evidence of thrombus. Normal compressibility, respiratory phasicity and response to augmentation. Calf Veins: No evidence of thrombus. Normal compressibility and flow on color Doppler imaging. Superficial Great Saphenous Vein: No evidence of thrombus. Normal compressibility. Venous Reflux:  None. Other Findings:  None. IMPRESSION:  Positive for DVT in the right posterior tibial vein.  Electronically Signed   By: Vanetta Chou M.D.   On: 05/10/2024 11:29   CT Angio Chest PE W and/or Wo Contrast Result Date: 05/10/2024 EXAM: CTA CHEST 05/10/2024 06:25:00 AM TECHNIQUE: CTA of the chest was performed without and with the administration of 75 mL of intravenous iohexol  (OMNIPAQUE ) 350 MG/ML injection. Multiplanar reformatted images are provided for review. MIP images are provided for review. Automated exposure control, iterative reconstruction, and/or weight based adjustment of the mA/kV was utilized to reduce the radiation dose to as low as reasonably achievable. COMPARISON: None available. CLINICAL HISTORY: Syncope/presyncope, cerebrovascular cause suspected. FINDINGS: PULMONARY ARTERIES: Pulmonary arteries are adequately opacified for evaluation. Filling defect within a segmental branch and subsegmental branches of the right lower lobe pulmonary artery is noted posteriorly, axial image 78/6 and axial image 76/6. Main pulmonary artery is normal in caliber. RV to LV ratio is equal to 1.1. MEDIASTINUM: Normal heart size. No pericardial effusion. There is no acute abnormality of the thoracic aorta. LYMPH NODES: No mediastinal, hilar or axillary lymphadenopathy. LUNGS AND PLEURA: Mild emphysema with diffuse bronchial wall thickening. Dependent ground-glass attenuation identified within the posterior lung bases. Areas of atelectasis or scar noted within the imaged portions of the lung bases including both lower lobes, lingular, and right middle lobe. Calcified granuloma identified within the superior segment of the right lower lobe. No pneumothorax or consolidative change. No evidence of pleural effusion. UPPER ABDOMEN: Limited images of the upper abdomen are unremarkable. SOFT TISSUES AND BONES: No acute bone or soft tissue abnormality. IMPRESSION: 1. Pulmonary embolism involving a segmental branch and subsegmental branches of the right lower  lobe pulmonary artery. 2. Emphysema. 3. Critical results were called to the ordering provider at the time of interpretation on 05/10/2024. I personally spoke with Dr. Palumbo, who acknowledged these findings. Electronically signed by: Waddell Calk MD 05/10/2024 06:40 AM EST RP Workstation: [id number]   DG Chest Portable 1 View Result Date: 05/10/2024 EXAM: 1 VIEW(S) XRAY OF THE CHEST 05/10/2024 01:44:00 AM COMPARISON: None available. CLINICAL HISTORY: painetvc FINDINGS: LUNGS AND PLEURA: Bibasilar atelectasis. No pleural effusion. No pneumothorax. HEART AND MEDIASTINUM: No acute abnormality of the cardiac and mediastinal silhouettes. BONES AND SOFT TISSUES: No acute osseous abnormality. IMPRESSION: 1. No acute cardiopulmonary abnormality. 2. Mild bibasilar atelectasis. Electronically signed by: Oneil Devonshire MD 05/10/2024 01:51 AM EST RP Workstation: [id number]   Assessment and Plan:  KASPER MUDRICK is a 53 y.o. 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   Paroxysmal AF/RVR Segmental/subsegmental PE AF/VR 100s overnight, now RVR this morning. AM dose of lopressor  held with hypotension although asx. Now with AF/RVR 120-150s. Minimally sx with some palpitations/fatigue. Reviewed different options with either continuation of lopressor  and tolerating a lower BP vs switching to AAD. I think the most efficient thing would be to go ahead and start amio in case he continues to have difficult to control AF with borderline BP. Reduced lopressor  from 25 mg q6h to bid. Add amio infusion without bolus. Plan will be to load and if we get him rate controlled or he chemically cardioverts then can get him home and in a few weeks get him off amio after he recovers from the PE. I will arrange f/u with me in a month. General cardiology will continue to follow this week. TTE ideally when he is either better rate controlled  or converts to NSR. If he isn't  able to be rate controlled then would need to consider DCCV before discharge but would like to avoid this since the PE may be the trigger of his recurrent arrhythmias. Started Xarelto  last night for both PE and AF. TSH was nl. Get CMP tomorrow with AM labs.   For questions or updates, please contact [facility] Please consult [email or web address] for contact info under   Signed, Donnice DELENA Primus, MD  05/11/2024 6:09 AM     [1] No Known Allergies  "

## 2024-05-11 NOTE — Plan of Care (Signed)
" °  Problem: Education: Goal: Knowledge of General Education information will improve Description: Including pain rating scale, medication(s)/side effects and non-pharmacologic comfort measures Outcome: Progressing   Problem: Clinical Measurements: Goal: Ability to maintain clinical measurements within normal limits will improve Outcome: Progressing Goal: Will remain free from infection Outcome: Progressing Goal: Diagnostic test results will improve Outcome: Progressing Goal: Respiratory complications will improve Outcome: Progressing Goal: Cardiovascular complication will be avoided Outcome: Progressing   Problem: Activity: Goal: Risk for activity intolerance will decrease Outcome: Progressing   Problem: Elimination: Goal: Will not experience complications related to bowel motility Outcome: Progressing Goal: Will not experience complications related to urinary retention Outcome: Progressing   Problem: Nutrition: Goal: Adequate nutrition will be maintained Outcome: Progressing   Problem: Pain Managment: Goal: General experience of comfort will improve and/or be controlled Outcome: Progressing   Problem: Safety: Goal: Ability to remain free from injury will improve Outcome: Progressing   Problem: Skin Integrity: Goal: Risk for impaired skin integrity will decrease Outcome: Progressing   Problem: Education: Goal: Knowledge of disease or condition will improve Outcome: Progressing Goal: Understanding of medication regimen will improve Outcome: Progressing Goal: Individualized Educational Video(s) Outcome: Progressing   Problem: Activity: Goal: Ability to tolerate increased activity will improve Outcome: Progressing   Problem: Cardiac: Goal: Ability to achieve and maintain adequate cardiopulmonary perfusion will improve Outcome: Progressing   "

## 2024-05-11 NOTE — Telephone Encounter (Signed)
 Pharmacy Patient Advocate Encounter  Insurance verification completed.    The patient is insured through [facility]. Patient has [employer], may use a copay card, and/or apply for patient assistance if available.    Ran test claim for Xarelto  20mg  tablet and the current 30 day co-pay is $35.   This test claim was processed through [employer]- copay amounts may vary at other pharmacies due to [employer], or as the patient moves through the different stages of their insurance plan.

## 2024-05-11 NOTE — TOC CM/SW Note (Signed)
 Transition of Care [facility]) - Inpatient Brief Assessment   Patient Details  Name: Edwin Whitaker MRN: [id number] Date of Birth: [birth year]  [facility] CM/SW Contact:    Sudie Erminio Deems, RN Phone Number: 05/11/2024, 2:37 PM   Clinical Narrative: Patient presented for palpitations-CT revealed PE. Benefits check completed for Xarelto  and co pay is $35. Patient has insurance and PCP. ICM will continue to follow for disposition needs as the patient progresses.     Transition of Care Asessment: Insurance and Status: Insurance coverage has been reviewed Patient has primary care physician: Yes Prior/Current Home Services: No current home services Social Drivers of Health Review: SDOH reviewed no interventions necessary Readmission risk has been reviewed: Yes Transition of care needs: no transition of care needs at this time

## 2024-05-11 NOTE — Plan of Care (Signed)

## 2024-05-11 NOTE — Progress Notes (Signed)
 " PROGRESS NOTE    Edwin Whitaker  FMW:[id number] DOB: [birth year] DOA: 05/10/2024 PCP: Edwin Charleston, MD (Inactive)   Brief Narrative:   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     05/09/2024: he presents ED for chief concerns of perceiving irregular heart rhythm.   He was in A-fib with RVR in the ER, blood pressure 82/67 and improved to 103/86, SpO2 99% on room air.  CT angiogram revealed right segmental/subsegmental PE with RV/LV ratio 1.1   serum sodium 141, potassium 3.9, chloride 101, bicarb 28, BUN of 18, serum creatinine 0.99, eGFR greater than 60, nonfasting glucose 121, WBC 7.6, hemoglobin 14.7, platelets of 227.   ED treatment: Heparin  bolus and gtt.   05/10/2024: Patient admitted to [facility] for chief concern of segmental and subsegmental PE in the right lower lobes. ----------------------------------- He denies shortness of breath, chest pain, chest pressure, abdominal pain, nausea, vomiting, leg swelling, dysuria, hematuria, diarrhea.    Of note, patient had an international flight in August 2025 where he flew directly to [place].  The flight was 3 to 4 hours.  2-3 days later he flew back from [place] to [place] on a direct flight that was 3 to 4 hours as well.  Patient admitted for A-fib with RVR, acute PE   Assessment & Plan:   Principal Problem:   Pulmonary embolism (HCC) Active Problems:   Atrial fibrillation with rapid ventricular response (HCC)   Deep vein thrombosis (DVT) of right posterior tibial vein (HCC)   Anxiety   Hyperlipidemia   * Pulmonary embolism (HCC)  Right segmental and subsegmental PE Ultrasound Doppler positive for right posterior tibial vein DVT Complete echo ordered and pending to evaluate for right heart strain, wall motion IV heparin , switch to Xarelto    Deep vein thrombosis (DVT) of right posterior  tibial vein (HCC) Continue heparin  GTT   Atrial fibrillation with RVR [facility]) Cardiology following, echo pending On IV amiodarone , metoprolol  dose reduced to 25 twice daily On Xarelto   Anxiety Patient has active prescription for alprazolam  1 mg, quantity of 30, 30 days, written and filled on 04/21/2024 Home alprazolam  1 mg nightly resumed   Hyperlipidemia Atorvastatin  20 mg nightly resumed   Chart reviewed.    DVT prophylaxis: Xarelto   code Status: Full code Diet: Heart healthy Family at the bedside  Subjective:  Patient sitting in bed not in any acute distress.  He denies any palpitations today.  Vital signs are stable.  He was initiated on amiodarone  drip per cardiology.  He denies any chest pain or shortness of breath.  He is on room air.  Objective: Vitals:   05/11/24 0600 05/11/24 0840 05/11/24 0936 05/11/24 1309  BP: 93/69 90/68 104/66 104/77  Pulse:  93  92  Resp:  16  20  Temp:  97.6 F (36.4 C)  97.7 F (36.5 C)  TempSrc:  Oral  Oral  SpO2:  94%  95%  Weight:      Height:        Intake/Output Summary (Last 24 hours) at 05/11/2024 1352 Last data filed at 05/11/2024 1309 Gross per 24 hour  Intake 1207.4 ml  Output 800 ml  Net 407.4 ml   Filed Weights   05/10/24 0132 05/10/24 1523  Weight: 97.5 kg 99.2 kg    Examination:  General: Alert, oriented not in any acute distress Chest: No wheezing or crackles CVs: S1, S2,  no murmur, irregular rhythm, tachycardia Abdomen: Soft, nontender Extremities: No edema   Data Reviewed: I have personally reviewed following labs and imaging studies  CBC: Recent Labs  Lab 05/10/24 0130 05/11/24 0543  WBC 7.6 7.0  NEUTROABS 3.5  --   HGB 14.7 15.4  HCT 43.8 45.3  MCV 90.3 91.1  PLT 227 251   Basic Metabolic Panel: Recent Labs  Lab 05/10/24 0130 05/11/24 0543  NA 141 139  K 3.9 4.3  CL 101 105  CO2 28 25  GLUCOSE 121* 117*  BUN 18 15  CREATININE 0.99 0.95  CALCIUM  9.3 8.3*   GFR: Estimated  Creatinine Clearance: 107.1 mL/min (by C-G formula based on SCr of 0.95 mg/dL). Liver Function Tests: No results for input(s): AST, ALT, ALKPHOS, BILITOT, PROT, ALBUMIN in the last 168 hours. No results for input(s): LIPASE, AMYLASE in the last 168 hours. No results for input(s): AMMONIA in the last 168 hours. Coagulation Profile: No results for input(s): INR, PROTIME in the last 168 hours. Cardiac Enzymes: No results for input(s): CKTOTAL, CKMB, CKMBINDEX, TROPONINI in the last 168 hours. BNP (last 3 results) No results for input(s): PROBNP in the last 8760 hours. HbA1C: No results for input(s): HGBA1C in the last 72 hours. CBG: No results for input(s): GLUCAP in the last 168 hours. Lipid Profile: No results for input(s): CHOL, HDL, LDLCALC, TRIG, CHOLHDL, LDLDIRECT in the last 72 hours. Thyroid Function Tests: Recent Labs    05/10/24 2007  TSH 2.080   Anemia Panel: No results for input(s): VITAMINB12, FOLATE, FERRITIN, TIBC, IRON, RETICCTPCT in the last 72 hours. Sepsis Labs: No results for input(s): PROCALCITON, LATICACIDVEN in the last 168 hours.  No results found for this or any previous visit (from the past 240 hours).       Radiology Studies: US  Venous Img Lower Bilateral Result Date: 05/10/2024 CLINICAL DATA:  Pulmonary embolism.  Evaluate for DVT. EXAM: BILATERAL LOWER EXTREMITY VENOUS DOPPLER ULTRASOUND TECHNIQUE: Gray-scale sonography with graded compression, as well as color Doppler and duplex ultrasound were performed to evaluate the lower extremity deep venous systems from the level of the common femoral vein and including the common femoral, femoral, profunda femoral, popliteal and calf veins including the posterior tibial, peroneal and gastrocnemius veins when visible. The superficial great saphenous vein was also interrogated. Spectral Doppler was utilized to evaluate flow at rest and with distal  augmentation maneuvers in the common femoral, femoral and popliteal veins. COMPARISON:  None Available. FINDINGS: RIGHT LOWER EXTREMITY Common Femoral Vein: No evidence of thrombus. Normal compressibility, respiratory phasicity and response to augmentation. Saphenofemoral Junction: No evidence of thrombus. Normal compressibility and flow on color Doppler imaging. Profunda Femoral Vein: No evidence of thrombus. Normal compressibility and flow on color Doppler imaging. Femoral Vein: No evidence of thrombus. Normal compressibility, respiratory phasicity and response to augmentation. Popliteal Vein: No evidence of thrombus. Normal compressibility, respiratory phasicity and response to augmentation. Calf Veins: Near occlusive thrombus in the right posterior tibial vein. Superficial Great Saphenous Vein: No evidence of thrombus. Normal compressibility. Venous Reflux:  None. Other Findings:  None. LEFT LOWER EXTREMITY Common Femoral Vein: No evidence of thrombus. Normal compressibility, respiratory phasicity and response to augmentation. Saphenofemoral Junction: No evidence of thrombus. Normal compressibility and flow on color Doppler imaging. Profunda Femoral Vein: No evidence of thrombus. Normal compressibility and flow on color Doppler imaging. Femoral Vein: No evidence of thrombus. Normal compressibility, respiratory phasicity and response to augmentation. Popliteal Vein: No evidence of thrombus. Normal compressibility, respiratory phasicity  and response to augmentation. Calf Veins: No evidence of thrombus. Normal compressibility and flow on color Doppler imaging. Superficial Great Saphenous Vein: No evidence of thrombus. Normal compressibility. Venous Reflux:  None. Other Findings:  None. IMPRESSION: Positive for DVT in the right posterior tibial vein. Electronically Signed   By: Vanetta Chou M.D.   On: 05/10/2024 11:29   CT Angio Chest PE W and/or Wo Contrast Result Date: 05/10/2024 EXAM: CTA CHEST 05/10/2024  06:25:00 AM TECHNIQUE: CTA of the chest was performed without and with the administration of 75 mL of intravenous iohexol  (OMNIPAQUE ) 350 MG/ML injection. Multiplanar reformatted images are provided for review. MIP images are provided for review. Automated exposure control, iterative reconstruction, and/or weight based adjustment of the mA/kV was utilized to reduce the radiation dose to as low as reasonably achievable. COMPARISON: None available. CLINICAL HISTORY: Syncope/presyncope, cerebrovascular cause suspected. FINDINGS: PULMONARY ARTERIES: Pulmonary arteries are adequately opacified for evaluation. Filling defect within a segmental branch and subsegmental branches of the right lower lobe pulmonary artery is noted posteriorly, axial image 78/6 and axial image 76/6. Main pulmonary artery is normal in caliber. RV to LV ratio is equal to 1.1. MEDIASTINUM: Normal heart size. No pericardial effusion. There is no acute abnormality of the thoracic aorta. LYMPH NODES: No mediastinal, hilar or axillary lymphadenopathy. LUNGS AND PLEURA: Mild emphysema with diffuse bronchial wall thickening. Dependent ground-glass attenuation identified within the posterior lung bases. Areas of atelectasis or scar noted within the imaged portions of the lung bases including both lower lobes, lingular, and right middle lobe. Calcified granuloma identified within the superior segment of the right lower lobe. No pneumothorax or consolidative change. No evidence of pleural effusion. UPPER ABDOMEN: Limited images of the upper abdomen are unremarkable. SOFT TISSUES AND BONES: No acute bone or soft tissue abnormality. IMPRESSION: 1. Pulmonary embolism involving a segmental branch and subsegmental branches of the right lower lobe pulmonary artery. 2. Emphysema. 3. Critical results were called to the ordering provider at the time of interpretation on 05/10/2024. I personally spoke with Dr. Palumbo, who acknowledged these findings. Electronically  signed by: Waddell Calk MD 05/10/2024 06:40 AM EST RP Workstation: [id number]   DG Chest Portable 1 View Result Date: 05/10/2024 EXAM: 1 VIEW(S) XRAY OF THE CHEST 05/10/2024 01:44:00 AM COMPARISON: None available. CLINICAL HISTORY: painetvc FINDINGS: LUNGS AND PLEURA: Bibasilar atelectasis. No pleural effusion. No pneumothorax. HEART AND MEDIASTINUM: No acute abnormality of the cardiac and mediastinal silhouettes. BONES AND SOFT TISSUES: No acute osseous abnormality. IMPRESSION: 1. No acute cardiopulmonary abnormality. 2. Mild bibasilar atelectasis. Electronically signed by: Oneil Devonshire MD 05/10/2024 01:51 AM EST RP Workstation: [id number]        Scheduled Meds:  ALPRAZolam   1 mg Oral QHS   atorvastatin   20 mg Oral QHS   metoprolol  tartrate  25 mg Oral BID   Rivaroxaban   15 mg Oral BID WC   Followed by   NOREEN ON 05/31/2024] rivaroxaban   20 mg Oral Q supper   Continuous Infusions:  amiodarone  30 mg/hr (05/11/24 1321)          Derryl Duval, MD [facility] 05/11/2024, 1:52 PM   "

## 2024-05-12 ENCOUNTER — Other Ambulatory Visit (HOSPITAL_COMMUNITY): Payer: Self-pay

## 2024-05-12 DIAGNOSIS — I2699 Other pulmonary embolism without acute cor pulmonale: Secondary | ICD-10-CM | POA: Diagnosis not present

## 2024-05-12 DIAGNOSIS — I4891 Unspecified atrial fibrillation: Secondary | ICD-10-CM | POA: Diagnosis not present

## 2024-05-12 DIAGNOSIS — I480 Paroxysmal atrial fibrillation: Secondary | ICD-10-CM

## 2024-05-12 LAB — BASIC METABOLIC PANEL WITH GFR
Anion gap: 10 (ref 5–15)
BUN: 21 mg/dL — ABNORMAL HIGH (ref 6–20)
CO2: 24 mmol/L (ref 22–32)
Calcium: 8.6 mg/dL — ABNORMAL LOW (ref 8.9–10.3)
Chloride: 103 mmol/L (ref 98–111)
Creatinine, Ser: 0.96 mg/dL (ref 0.61–1.24)
GFR, Estimated: >60 mL/min
Glucose, Bld: 121 mg/dL — ABNORMAL HIGH (ref 70–99)
Potassium: 4.2 mmol/L (ref 3.5–5.1)
Sodium: 137 mmol/L (ref 135–145)

## 2024-05-12 LAB — CBC WITH DIFFERENTIAL/PLATELET
Abs Immature Granulocytes: 0.03 K/uL (ref 0.00–0.07)
Basophils Absolute: 0.1 K/uL (ref 0.0–0.1)
Basophils Relative: 1 %
Eosinophils Absolute: 0.2 K/uL (ref 0.0–0.5)
Eosinophils Relative: 2 %
HCT: 44.2 % (ref 39.0–52.0)
Hemoglobin: 15.2 g/dL (ref 13.0–17.0)
Immature Granulocytes: 0 %
Lymphocytes Relative: 35 %
Lymphs Abs: 2.9 K/uL (ref 0.7–4.0)
MCH: 31.1 pg (ref 26.0–34.0)
MCHC: 34.4 g/dL (ref 30.0–36.0)
MCV: 90.6 fL (ref 80.0–100.0)
Monocytes Absolute: 0.7 K/uL (ref 0.1–1.0)
Monocytes Relative: 8 %
Neutro Abs: 4.4 K/uL (ref 1.7–7.7)
Neutrophils Relative %: 54 %
Platelets: 243 K/uL (ref 150–400)
RBC: 4.88 MIL/uL (ref 4.22–5.81)
RDW: 13 % (ref 11.5–15.5)
WBC: 8.2 K/uL (ref 4.0–10.5)
nRBC: 0 % (ref 0.0–0.2)

## 2024-05-12 LAB — MAGNESIUM: Magnesium: 1.9 mg/dL (ref 1.7–2.4)

## 2024-05-12 MED ORDER — AMIODARONE HCL 200 MG PO TABS
400.0000 mg | ORAL_TABLET | Freq: Two times a day (BID) | ORAL | Status: DC
  Administered 2024-05-12 – 2024-05-13 (×3): 400 mg via ORAL
  Filled 2024-05-12 (×3): qty 2

## 2024-05-12 MED ORDER — AMIODARONE HCL 200 MG PO TABS: 200.0000 mg | ORAL_TABLET | Freq: Two times a day (BID) | ORAL | Status: DC

## 2024-05-12 MED ORDER — MAGNESIUM SULFATE 2 GM/50ML IV SOLN: 2.0000 g | Freq: Once | INTRAVENOUS | Status: DC

## 2024-05-12 MED ORDER — MAGNESIUM OXIDE -MG SUPPLEMENT 400 (240 MG) MG PO TABS
800.0000 mg | ORAL_TABLET | Freq: Once | ORAL | Status: AC
  Administered 2024-05-12: 800 mg via ORAL
  Filled 2024-05-12: qty 2

## 2024-05-12 NOTE — Progress Notes (Addendum)
"  °  Progress Note  Patient Name: Edwin Whitaker Date of Encounter: 05/12/2024 [facility] Cardiologist: Dorn Lesches, MD   Interval Summary    No complaints, wants to go home.   Vital Signs Vitals:   05/11/24 2258 05/11/24 2342 05/12/24 0457 05/12/24 0822  BP: 97/64 100/68 103/75 110/79  Pulse: (!) 118 76 70 75  Resp:  18 18 20   Temp:  97.7 F (36.5 C) 97.8 F (36.6 C) 97.9 F (36.6 C)  TempSrc:  Oral Oral Oral  SpO2:  90% 92% 95%  Weight:      Height:        Intake/Output Summary (Last 24 hours) at 05/12/2024 1015 Last data filed at 05/11/2024 1841 Gross per 24 hour  Intake 1246.83 ml  Output --  Net 1246.83 ml      05/10/2024    3:23 PM 05/10/2024    1:32 AM 12/30/2013    8:21 AM  Last 3 Weights  Weight (lbs) 218 lb 12.8 oz 215 lb 213 lb  Weight (kg) 99.247 kg 97.523 kg 96.616 kg      Telemetry/ECG   Atrial fibrillation with conversion to sinus rhythm around 0120 - Personally Reviewed  Physical Exam  GEN: No acute distress.   Neck: No JVD Cardiac: RRR, no murmurs, rubs, or gallops.  Respiratory: Clear to auscultation bilaterally. GI: Soft, nontender, non-distended  MS: No edema  Assessment & Plan   53 y.o. male with a hx of paroxysmal AF, HTN, HLD, GERD, PVCs on propranolol , and anxiety who was seen for the evaluation of AF/RVR and PE.   Paroxysmal atrial fibrillation -- known hx of PAF, but suspect triggered by PE this admission -- rates initially difficult to control with metoprolol  and therefore was started on IV amiodarone  yesterday -- converted to sinus rhythm this morning around 0120 and maintaining since that time -- Echo with LVEF of 60-65%, moderately reduced RV function  -- will transition to PO amiodarone  with 400mg  BID x7 days, 200mg  BID x7 days and 200mg  daily. Would watch through today to ensure no breakthrough atrial fib with transition to PO amiodarone  -- continue metoprolol  tartrate 25mg  BID  -- remains on PE dose  xarelto   PE DVT -- right segmental and subsegmental PE and right posterior tibial vein DVT -- as above on Xarelto   HLD -- on statin    For questions or updates, please contact [facility] Please consult [email or web address] for contact info under   Signed, Manuelita Rummer, NP   Agree with note by Manuelita Rummer NP-C  Patient admitted with pulmonary embolism.  Venous Doppler showed tibial vein DVT.  He did have A-fib with RVR and was seen by EP who started amiodarone  drip.  He ultimately pharmacologically converted to sinus rhythm.  He is on oral anticoagulation.  LV function was normal although he did have decreased RV function as a result of his pulmonary embolism.  Plan was to convert to oral amiodarone  with a 400 mg p.o. twice daily load for 1 week followed by declining doses.  I favor observation for the next 24 hours while receiving his p.o. load to make sure he does not revert back into A-fib.  Will arrange outpatient follow-up.  Dorn DOROTHA Lesches, M.D., FACP, [facility], LYNITA [facility] [address] [place], [place]  [address]  [phone] 05/12/2024 12:11 PM   "

## 2024-05-12 NOTE — Progress Notes (Signed)
 " PROGRESS NOTE    Edwin Whitaker  FMW:[id number] DOB: 10-20-1970 DOA: 05/10/2024 PCP: Delice Charleston, MD (Inactive)   Brief Narrative:   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     05/09/2024: he presents ED for chief concerns of perceiving irregular heart rhythm.   He was in A-fib with RVR in the ER, blood pressure 82/67 and improved to 103/86, SpO2 99% on room air.  CT angiogram revealed right segmental/subsegmental PE with RV/LV ratio 1.1   serum sodium 141, potassium 3.9, chloride 101, bicarb 28, BUN of 18, serum creatinine 0.99, eGFR greater than 60, nonfasting glucose 121, WBC 7.6, hemoglobin 14.7, platelets of 227.     Patient admitted for A-fib with RVR, acute PE.  Evaluated by cardiology, initiated on amiodarone  as well as heparin  which has since been switched to p.o. Amio/Eliquis    Assessment & Plan:   Principal Problem:   Pulmonary embolism (HCC) Active Problems:   Atrial fibrillation with rapid ventricular response (HCC)   Deep vein thrombosis (DVT) of right posterior tibial vein (HCC)   Anxiety   Hyperlipidemia   * Pulmonary embolism (HCC)  Right segmental and subsegmental PE Ultrasound Doppler positive for right posterior tibial vein DVT Echocardiogram with normal left ventricular function, all to moderate RV dysfunction, likely secondary to PE. Tolerating Xarelto  well.  Does not have any symptoms at rest.  Hemodynamically stable. Will continue Xarelto    deep vein thrombosis (DVT) of right posterior tibial vein (HCC) Continue Xarelto   Atrial fibrillation with RVR (HCC) On IV amiodarone , converted to sinus rhythm overnight 12/23 Started on oral amiodarone  load per cardiology. Continue to monitor in telemetry.  Anxiety Patient has active prescription for alprazolam  1 mg, quantity of 30, 30 days, written and filled on 04/21/2024 Home alprazolam  1  mg nightly resumed   Hyperlipidemia Atorvastatin  20 mg nightly resumed   Chart reviewed.    DVT prophylaxis: Xarelto   code Status: Full code Diet: Heart healthy Family at the bedside  Subjective:  Patient sitting in bed not in any acute distress.  He converted to sinus rhythm overnight. He asks if he could go home today with oral amiodarone .  He is evaluated by cardiology today who recommended 24 hours of p.o. amiodarone  load prior to discharge. Patient denies chest pain or shortness of breath.   Objective: Vitals:   05/11/24 2342 05/12/24 0457 05/12/24 0822 05/12/24 1218  BP: 100/68 103/75 110/79 112/83  Pulse: 76 70 75 83  Resp: 18 18 20 20   Temp: 97.7 F (36.5 C) 97.8 F (36.6 C) 97.9 F (36.6 C) 98.5 F (36.9 C)  TempSrc: Oral Oral Oral Oral  SpO2: 90% 92% 95% 95%  Weight:      Height:        Intake/Output Summary (Last 24 hours) at 05/12/2024 1534 Last data filed at 05/12/2024 0930 Gross per 24 hour  Intake 1006.83 ml  Output --  Net 1006.83 ml   Filed Weights   05/10/24 0132 05/10/24 1523  Weight: 97.5 kg 99.2 kg    Examination:  General: Alert, oriented not in any acute distress Chest: No wheezing or crackles CVs: S1, S2, no murmur, regular rhythm,  Abdomen: Soft, nontender Extremities: No edema   Data Reviewed: I have personally reviewed following labs and imaging studies  CBC: Recent Labs  Lab 05/10/24 0130 05/11/24 0543 05/12/24 0453  WBC 7.6 7.0 8.2  NEUTROABS 3.5  --  4.4  HGB 14.7 15.4 15.2  HCT 43.8 45.3 44.2  MCV 90.3 91.1 90.6  PLT 227 251 243   Basic Metabolic Panel: Recent Labs  Lab 05/10/24 0130 05/11/24 0543 05/12/24 0453  NA 141 139 137  K 3.9 4.3 4.2  CL 101 105 103  CO2 28 25 24   GLUCOSE 121* 117* 121*  BUN 18 15 21*  CREATININE 0.99 0.95 0.96  CALCIUM  9.3 8.3* 8.6*  MG  --   --  1.9   GFR: Estimated Creatinine Clearance: 106 mL/min (by C-G formula based on SCr of 0.96 mg/dL). Liver Function Tests: No  results for input(s): AST, ALT, ALKPHOS, BILITOT, PROT, ALBUMIN in the last 168 hours. No results for input(s): LIPASE, AMYLASE in the last 168 hours. No results for input(s): AMMONIA in the last 168 hours. Coagulation Profile: No results for input(s): INR, PROTIME in the last 168 hours. Cardiac Enzymes: No results for input(s): CKTOTAL, CKMB, CKMBINDEX, TROPONINI in the last 168 hours. BNP (last 3 results) No results for input(s): PROBNP in the last 8760 hours. HbA1C: No results for input(s): HGBA1C in the last 72 hours. CBG: No results for input(s): GLUCAP in the last 168 hours. Lipid Profile: No results for input(s): CHOL, HDL, LDLCALC, TRIG, CHOLHDL, LDLDIRECT in the last 72 hours. Thyroid Function Tests: Recent Labs    05/10/24 2007  TSH 2.080   Anemia Panel: No results for input(s): VITAMINB12, FOLATE, FERRITIN, TIBC, IRON, RETICCTPCT in the last 72 hours. Sepsis Labs: No results for input(s): PROCALCITON, LATICACIDVEN in the last 168 hours.  No results found for this or any previous visit (from the past 240 hours).       Radiology Studies: ECHOCARDIOGRAM COMPLETE Result Date: 05/11/2024    ECHOCARDIOGRAM REPORT   Patient Name:   Edwin Whitaker Date of Exam: 05/11/2024 Medical Rec #:  [id number]          Height:       72.0 in Accession #:    [id number]         Weight:       218.8 lb Date of Birth:  June 25, 1970          BSA:          2.213 m Patient Age:    53 years           BP:           104/77 mmHg Patient Gender: M                  HR:           115 bpm. Exam Location:  Inpatient Procedure: 2D Echo, Cardiac Doppler and Color Doppler (Both Spectral and Color            Flow Doppler were utilized during procedure). Indications:    Pulmonary Embolus and Atrial Fibrillation  History:        Patient has no prior history of Echocardiogram examinations.                 Arrythmias:Atrial Fibrillation; Risk  Factors:Dyslipidemia.  Sonographer:    Sherlean Dubin Referring Phys: [id number] AMY N COX  Sonographer Comments: Image acquisition challenging due to respiratory motion. IMPRESSIONS  1. Left ventricular ejection fraction, by estimation, is 60 to 65%. The left ventricle has normal function. The left ventricle has no regional wall motion abnormalities. Left ventricular diastolic parameters are indeterminate.  2. Right ventricular systolic function is moderately reduced. The right ventricular size is mildly enlarged.  3. Left atrial size was mildly dilated.  4. Right atrial size was mildly dilated.  5. The mitral valve is normal in structure. No evidence of mitral valve regurgitation. No evidence of mitral stenosis.  6. The aortic valve is tricuspid. There is mild calcification of the aortic valve. Aortic valve regurgitation is not visualized. Aortic valve sclerosis/calcification is present, without any evidence of aortic stenosis.  7. The inferior vena cava is normal in size with <50% respiratory variability, suggesting right atrial pressure of 8 mmHg. FINDINGS  Left Ventricle: Left ventricular ejection fraction, by estimation, is 60 to 65%. The left ventricle has normal function. The left ventricle has no regional wall motion abnormalities. The left ventricular internal cavity size was normal in size. There is  no left ventricular hypertrophy. Left ventricular diastolic parameters are indeterminate. Right Ventricle: The right ventricular size is mildly enlarged. No increase in right ventricular wall thickness. Right ventricular systolic function is moderately reduced. Left Atrium: Left atrial size was mildly dilated. Right Atrium: Right atrial size was mildly dilated. Pericardium: There is no evidence of pericardial effusion. Mitral Valve: The mitral valve is normal in structure. No evidence of mitral valve regurgitation. No evidence of mitral valve stenosis. Tricuspid Valve: The tricuspid valve is normal in  structure. Tricuspid valve regurgitation is not demonstrated. No evidence of tricuspid stenosis. Aortic Valve: The aortic valve is tricuspid. There is mild calcification of the aortic valve. Aortic valve regurgitation is not visualized. Aortic valve sclerosis/calcification is present, without any evidence of aortic stenosis. Aortic valve mean gradient measures 3.0 mmHg. Aortic valve peak gradient measures 5.3 mmHg. Aortic valve area, by VTI measures 2.70 cm. Pulmonic Valve: The pulmonic valve was normal in structure. Pulmonic valve regurgitation is trivial. No evidence of pulmonic stenosis. Aorta: The aortic root is normal in size and structure. Venous: The inferior vena cava is normal in size with less than 50% respiratory variability, suggesting right atrial pressure of 8 mmHg. IAS/Shunts: No atrial level shunt detected by color flow Doppler.  LEFT VENTRICLE PLAX 2D LVIDd:         4.50 cm   Diastology LVIDs:         3.30 cm   LV e' medial:    11.30 cm/s LV PW:         0.90 cm   LV E/e' medial:  6.0 LV IVS:        1.00 cm   LV e' lateral:   14.60 cm/s LVOT diam:     2.00 cm   LV E/e' lateral: 4.7 LV SV:         48 LV SV Index:   22 LVOT Area:     3.14 cm  RIGHT VENTRICLE             IVC RV Basal diam:  2.90 cm     IVC diam: 1.60 cm RV Mid diam:    2.70 cm RV S prime:     11.50 cm/s  PULMONARY VEINS TAPSE (M-mode): 1.3 cm      Diastolic Velocity: 28.30 cm/s                             S/D Velocity:       1.70                             Systolic Velocity:  47.10 cm/s LEFT ATRIUM  Index        RIGHT ATRIUM           Index LA diam:        3.40 cm 1.54 cm/m   RA Area:     16.80 cm LA Vol (A2C):   47.8 ml 21.59 ml/m  RA Volume:   40.90 ml  18.48 ml/m LA Vol (A4C):   47.4 ml 21.41 ml/m LA Biplane Vol: 51.3 ml 23.18 ml/m  AORTIC VALVE AV Area (Vmax):    2.98 cm AV Area (Vmean):   2.85 cm AV Area (VTI):     2.70 cm AV Vmax:           115.00 cm/s AV Vmean:          79.500 cm/s AV VTI:            0.180  m AV Peak Grad:      5.3 mmHg AV Mean Grad:      3.0 mmHg LVOT Vmax:         109.00 cm/s LVOT Vmean:        72.000 cm/s LVOT VTI:          0.154 m LVOT/AV VTI ratio: 0.86  AORTA Ao Root diam: 3.70 cm Ao Asc diam:  2.90 cm MITRAL VALVE               TRICUSPID VALVE MV Area (PHT): 4.49 cm    TR Peak grad:   3.3 mmHg MV Decel Time: 169 msec    TR Vmax:        91.50 cm/s MV E velocity: 68.10 cm/s                            SHUNTS                            Systemic VTI:  0.15 m                            Systemic Diam: 2.00 cm Toribio Fuel MD Electronically signed by Toribio Fuel MD Signature Date/Time: 05/11/2024/10:44:29 PM    Final         Scheduled Meds:  ALPRAZolam   1 mg Oral QHS   amiodarone   400 mg Oral BID   Followed by   NOREEN ON 05/19/2024] amiodarone   200 mg Oral BID   atorvastatin   20 mg Oral QHS   metoprolol  tartrate  25 mg Oral BID   Rivaroxaban   15 mg Oral BID WC   Followed by   NOREEN ON 05/31/2024] rivaroxaban   20 mg Oral Q supper   Continuous Infusions:          Derryl Duval, MD [facility] 05/12/2024, 3:34 PM   "

## 2024-05-13 ENCOUNTER — Telehealth: Payer: Self-pay | Admitting: Cardiology

## 2024-05-13 ENCOUNTER — Other Ambulatory Visit (HOSPITAL_COMMUNITY): Payer: Self-pay

## 2024-05-13 DIAGNOSIS — I2699 Other pulmonary embolism without acute cor pulmonale: Secondary | ICD-10-CM | POA: Diagnosis not present

## 2024-05-13 DIAGNOSIS — I4891 Unspecified atrial fibrillation: Secondary | ICD-10-CM | POA: Diagnosis not present

## 2024-05-13 MED ORDER — ATORVASTATIN CALCIUM 20 MG PO TABS: 20.0000 mg | ORAL_TABLET | Freq: Every day | ORAL | Status: AC

## 2024-05-13 MED ORDER — RIVAROXABAN (XARELTO) VTE STARTER PACK (15 & 20 MG)
ORAL_TABLET | ORAL | 0 refills | Status: AC
  Filled 2024-05-13: qty 51, 30d supply, fill #0

## 2024-05-13 MED ORDER — AMIODARONE HCL 200 MG PO TABS
ORAL_TABLET | ORAL | 0 refills | Status: AC
  Filled 2024-05-13: qty 70, 30d supply, fill #0

## 2024-05-13 MED ORDER — METOPROLOL TARTRATE 25 MG PO TABS
25.0000 mg | ORAL_TABLET | Freq: Two times a day (BID) | ORAL | 0 refills | Status: AC
  Filled 2024-05-13: qty 60, 30d supply, fill #0

## 2024-05-13 NOTE — Telephone Encounter (Signed)
 Hello this patient was seen in patient but they were discharged before cardiology could arrange follow-up.  Please have them follow-up with an APP in about 2 weeks and with Dr. Court around 6 to 8 weeks after that place.

## 2024-05-13 NOTE — Discharge Instructions (Addendum)
 Information on my medicine - XARELTO  (rivaroxaban )  This medication education was reviewed with me or my healthcare representative as part of my discharge preparation.  WHY WAS XARELTO  PRESCRIBED FOR YOU? Xarelto  was prescribed to treat blood clots that may have been found in the veins of your legs (deep vein thrombosis) or in your lungs (pulmonary embolism) and to reduce the risk of them occurring again.  What do you need to know about Xarelto ? The starting dose is one 15 mg tablet taken TWICE daily with food for the FIRST 21 DAYS then on  05/31/2024  the dose is changed to one 20 mg tablet taken ONCE A DAY with your evening meal.  DO NOT stop taking Xarelto  without talking to the health care provider who prescribed the medication.  Refill your prescription for 20 mg tablets before you run out.  After discharge, you should have regular check-up appointments with your healthcare provider that is prescribing your Xarelto .  In the future your dose may need to be changed if your kidney function changes by a significant amount.  What do you do if you miss a dose? If you are taking Xarelto  TWICE DAILY and you miss a dose, take it as soon as you remember. You may take two 15 mg tablets (total 30 mg) at the same time then resume your regularly scheduled 15 mg twice daily the next day.  If you are taking Xarelto  ONCE DAILY and you miss a dose, take it as soon as you remember on the same day then continue your regularly scheduled once daily regimen the next day. Do not take two doses of Xarelto  at the same time.   Important Safety Information Xarelto  is a blood thinner medicine that can cause bleeding. You should call your healthcare provider right away if you experience any of the following: Bleeding from an injury or your nose that does not stop. Unusual colored urine (red or dark brown) or unusual colored stools (red or black). Unusual bruising for unknown reasons. A serious fall or if you  hit your head (even if there is no bleeding).  Some medicines may interact with Xarelto  and might increase your risk of bleeding while on Xarelto . To help avoid this, consult your healthcare provider or pharmacist prior to using any new prescription or non-prescription medications, including herbals, vitamins, non-steroidal anti-inflammatory drugs (NSAIDs) and supplements.  This website has more information on Xarelto : [email or web address] .com.

## 2024-05-13 NOTE — Discharge Summary (Addendum)
 Physician Discharge Summary  Edwin Whitaker FMW:[id number] DOB: Aug 12, 1970 DOA: 05/10/2024  PCP: Delice Charleston, MD (Inactive)  Admit date: 05/10/2024 Discharge date: 05/13/2024  Admitted From: Home Disposition: Home  Recommendations for Outpatient Follow-up:  Follow up with PCP in 1 week  Monitor blood pressure at home  Continue Xarelto  for anticoagulation, amiodarone  dosing as prescribed, follow-up with cardiology next couple of weeks Follow up in ED if recurrence of symptoms.  Home Health: No Equipment/Devices: None  Discharge Condition: Stable CODE STATUS: Full Diet recommendation: Heart healthy  Brief/Interim Summary:  [name] is a 53 year old male with history of hyperlipidemia, hypertension, GERD, anxiety, history of PVCs on as needed propranolol .     05/09/2024: he presents ED for chief concerns of perceiving irregular heart rhythm.   He was in A-fib with RVR in the ER, blood pressure 82/67 and improved to 103/86, SpO2 99% on room air.   CT angiogram revealed right segmental/subsegmental PE with RV/LV ratio 1.1 , ultrasound revealed right leg DVT   serum sodium 141, potassium 3.9, chloride 101, bicarb 28, BUN of 18, serum creatinine 0.99, eGFR greater than 60, nonfasting glucose 121, WBC 7.6, hemoglobin 14.7, platelets of 227.     Patient admitted for A-fib with RVR, acute PE.   Evaluated by cardiology, initiated on IV amiodarone  as well as heparin  with conversion to sinus rhythm.  He was initiated on amiodarone  load 24 hours prior to discharge.  He remains in sinus rhythm. Discharged home in stable condition.  Prescribed Xarelto  15 mg twice daily for 3 weeks followed by 20 mg daily for PE treatment. Also amiodarone  400 mg twice daily for 7 days followed by 20 mg twice daily for 7 days followed by 200 mg daily. Propranolol  was discontinued, metoprolol  25 mg twice daily started. Patient discharged home in stable condition.  Discharge Diagnoses:   Principal Problem:   Pulmonary embolism (HCC) Active Problems:   Atrial fibrillation with rapid ventricular response (HCC)   Deep vein thrombosis (DVT) of right posterior tibial vein (HCC)   Anxiety   Hyperlipidemia    Discharge Instructions  Discharge Instructions     Amb referral to [facility]   Complete by: As directed    Call MD for:  extreme fatigue   Complete by: As directed    Call MD for:  persistant dizziness or light-headedness   Complete by: As directed    Diet - low sodium heart healthy   Complete by: As directed    Discharge instructions   Complete by: As directed    1. Start taking amiodarone  load as prescribed and f/u with cardiology 2. Start Xarelto  as prescribed and f/u with PCP/cardiology for refills 3. Stop propranolol  and start metoprolol  as prescribed   Increase activity slowly   Complete by: As directed       Allergies as of 05/13/2024   No Known Allergies      Medication List     STOP taking these medications    propranolol  10 MG tablet Commonly known as: INDERAL        TAKE these medications    ALPRAZolam  1 MG tablet Commonly known as: XANAX  Take 1 mg by mouth at bedtime.   amiodarone  200 MG tablet Commonly known as: PACERONE  Take 2 tablets (400 mg total) by mouth 2 (two) times daily for 6 days, THEN 1 tablet (200 mg total) 2 (two) times daily. Start taking on: May 13, 2024   atorvastatin  20 MG tablet Commonly known as: LIPITOR Take 1  tablet (20 mg total) by mouth daily.   clobetasol  0.05 % external solution Commonly known as: TEMOVATE  Apply 1 application topically 2 (two) times daily. What changed:  when to take this reasons to take this   ibuprofen 200 MG tablet Commonly known as: ADVIL Take 200-800 mg by mouth every 6 (six) hours as needed for moderate pain (pain score 4-6).   ketoconazole 2 % cream Commonly known as: NIZORAL Apply 1 application topically daily as needed for irritation (for face).    metoprolol  tartrate 25 MG tablet Commonly known as: LOPRESSOR  Take 1 tablet (25 mg total) by mouth 2 (two) times daily.   valACYclovir 500 MG tablet Commonly known as: VALTREX Take 500 mg by mouth daily as needed (Flare ups).   Xarelto  Starter Pack Generic drug: Rivaroxaban  Starter Pack (15 mg and 20 mg) Follow package directions: Take one 15mg  tablet by mouth twice a day. On day 22, switch to one 20mg  tablet once a day. Take with food.        Follow-up Information     Delice Charleston, MD. Schedule an appointment as soon as possible for a visit in 1 week(s).   Specialty: Internal Medicine Contact information: [address] [place] [address] [phone]                Allergies[1]  Consultations:    Procedures/Studies: ECHOCARDIOGRAM COMPLETE Result Date: 05/11/2024    ECHOCARDIOGRAM REPORT   Patient Name:   Edwin Whitaker Date of Exam: 05/11/2024 Medical Rec #:  [id number]          Height:       72.0 in Accession #:    [id number]         Weight:       218.8 lb Date of Birth:  10-11-1970          BSA:          2.213 m Patient Age:    53 years           BP:           104/77 mmHg Patient Gender: M                  HR:           115 bpm. Exam Location:  Inpatient Procedure: 2D Echo, Cardiac Doppler and Color Doppler (Both Spectral and Color            Flow Doppler were utilized during procedure). Indications:    Pulmonary Embolus and Atrial Fibrillation  History:        Patient has no prior history of Echocardiogram examinations.                 Arrythmias:Atrial Fibrillation; Risk Factors:Dyslipidemia.  Sonographer:    Sherlean Dubin Referring Phys: [id number] AMY N COX  Sonographer Comments: Image acquisition challenging due to respiratory motion. IMPRESSIONS  1. Left ventricular ejection fraction, by estimation, is 60 to 65%. The left ventricle has normal function. The left ventricle has no regional wall motion abnormalities. Left ventricular diastolic  parameters are indeterminate.  2. Right ventricular systolic function is moderately reduced. The right ventricular size is mildly enlarged.  3. Left atrial size was mildly dilated.  4. Right atrial size was mildly dilated.  5. The mitral valve is normal in structure. No evidence of mitral valve regurgitation. No evidence of mitral stenosis.  6. The aortic valve is tricuspid. There is mild calcification of the aortic valve.  Aortic valve regurgitation is not visualized. Aortic valve sclerosis/calcification is present, without any evidence of aortic stenosis.  7. The inferior vena cava is normal in size with <50% respiratory variability, suggesting right atrial pressure of 8 mmHg. FINDINGS  Left Ventricle: Left ventricular ejection fraction, by estimation, is 60 to 65%. The left ventricle has normal function. The left ventricle has no regional wall motion abnormalities. The left ventricular internal cavity size was normal in size. There is  no left ventricular hypertrophy. Left ventricular diastolic parameters are indeterminate. Right Ventricle: The right ventricular size is mildly enlarged. No increase in right ventricular wall thickness. Right ventricular systolic function is moderately reduced. Left Atrium: Left atrial size was mildly dilated. Right Atrium: Right atrial size was mildly dilated. Pericardium: There is no evidence of pericardial effusion. Mitral Valve: The mitral valve is normal in structure. No evidence of mitral valve regurgitation. No evidence of mitral valve stenosis. Tricuspid Valve: The tricuspid valve is normal in structure. Tricuspid valve regurgitation is not demonstrated. No evidence of tricuspid stenosis. Aortic Valve: The aortic valve is tricuspid. There is mild calcification of the aortic valve. Aortic valve regurgitation is not visualized. Aortic valve sclerosis/calcification is present, without any evidence of aortic stenosis. Aortic valve mean gradient measures 3.0 mmHg. Aortic valve  peak gradient measures 5.3 mmHg. Aortic valve area, by VTI measures 2.70 cm. Pulmonic Valve: The pulmonic valve was normal in structure. Pulmonic valve regurgitation is trivial. No evidence of pulmonic stenosis. Aorta: The aortic root is normal in size and structure. Venous: The inferior vena cava is normal in size with less than 50% respiratory variability, suggesting right atrial pressure of 8 mmHg. IAS/Shunts: No atrial level shunt detected by color flow Doppler.  LEFT VENTRICLE PLAX 2D LVIDd:         4.50 cm   Diastology LVIDs:         3.30 cm   LV e' medial:    11.30 cm/s LV PW:         0.90 cm   LV E/e' medial:  6.0 LV IVS:        1.00 cm   LV e' lateral:   14.60 cm/s LVOT diam:     2.00 cm   LV E/e' lateral: 4.7 LV SV:         48 LV SV Index:   22 LVOT Area:     3.14 cm  RIGHT VENTRICLE             IVC RV Basal diam:  2.90 cm     IVC diam: 1.60 cm RV Mid diam:    2.70 cm RV S prime:     11.50 cm/s  PULMONARY VEINS TAPSE (M-mode): 1.3 cm      Diastolic Velocity: 28.30 cm/s                             S/D Velocity:       1.70                             Systolic Velocity:  47.10 cm/s LEFT ATRIUM             Index        RIGHT ATRIUM           Index LA diam:        3.40 cm 1.54 cm/m   RA Area:  16.80 cm LA Vol (A2C):   47.8 ml 21.59 ml/m  RA Volume:   40.90 ml  18.48 ml/m LA Vol (A4C):   47.4 ml 21.41 ml/m LA Biplane Vol: 51.3 ml 23.18 ml/m  AORTIC VALVE AV Area (Vmax):    2.98 cm AV Area (Vmean):   2.85 cm AV Area (VTI):     2.70 cm AV Vmax:           115.00 cm/s AV Vmean:          79.500 cm/s AV VTI:            0.180 m AV Peak Grad:      5.3 mmHg AV Mean Grad:      3.0 mmHg LVOT Vmax:         109.00 cm/s LVOT Vmean:        72.000 cm/s LVOT VTI:          0.154 m LVOT/AV VTI ratio: 0.86  AORTA Ao Root diam: 3.70 cm Ao Asc diam:  2.90 cm MITRAL VALVE               TRICUSPID VALVE MV Area (PHT): 4.49 cm    TR Peak grad:   3.3 mmHg MV Decel Time: 169 msec    TR Vmax:        91.50 cm/s MV E velocity:  68.10 cm/s                            SHUNTS                            Systemic VTI:  0.15 m                            Systemic Diam: 2.00 cm Toribio Fuel MD Electronically signed by Toribio Fuel MD Signature Date/Time: 05/11/2024/10:44:29 PM    Final    US  Venous Img Lower Bilateral Result Date: 05/10/2024 CLINICAL DATA:  Pulmonary embolism.  Evaluate for DVT. EXAM: BILATERAL LOWER EXTREMITY VENOUS DOPPLER ULTRASOUND TECHNIQUE: Gray-scale sonography with graded compression, as well as color Doppler and duplex ultrasound were performed to evaluate the lower extremity deep venous systems from the level of the common femoral vein and including the common femoral, femoral, profunda femoral, popliteal and calf veins including the posterior tibial, peroneal and gastrocnemius veins when visible. The superficial great saphenous vein was also interrogated. Spectral Doppler was utilized to evaluate flow at rest and with distal augmentation maneuvers in the common femoral, femoral and popliteal veins. COMPARISON:  None Available. FINDINGS: RIGHT LOWER EXTREMITY Common Femoral Vein: No evidence of thrombus. Normal compressibility, respiratory phasicity and response to augmentation. Saphenofemoral Junction: No evidence of thrombus. Normal compressibility and flow on color Doppler imaging. Profunda Femoral Vein: No evidence of thrombus. Normal compressibility and flow on color Doppler imaging. Femoral Vein: No evidence of thrombus. Normal compressibility, respiratory phasicity and response to augmentation. Popliteal Vein: No evidence of thrombus. Normal compressibility, respiratory phasicity and response to augmentation. Calf Veins: Near occlusive thrombus in the right posterior tibial vein. Superficial Great Saphenous Vein: No evidence of thrombus. Normal compressibility. Venous Reflux:  None. Other Findings:  None. LEFT LOWER EXTREMITY Common Femoral Vein: No evidence of thrombus. Normal compressibility,  respiratory phasicity and response to augmentation. Saphenofemoral Junction: No evidence of thrombus. Normal compressibility and flow on color Doppler imaging. Profunda Femoral  Vein: No evidence of thrombus. Normal compressibility and flow on color Doppler imaging. Femoral Vein: No evidence of thrombus. Normal compressibility, respiratory phasicity and response to augmentation. Popliteal Vein: No evidence of thrombus. Normal compressibility, respiratory phasicity and response to augmentation. Calf Veins: No evidence of thrombus. Normal compressibility and flow on color Doppler imaging. Superficial Great Saphenous Vein: No evidence of thrombus. Normal compressibility. Venous Reflux:  None. Other Findings:  None. IMPRESSION: Positive for DVT in the right posterior tibial vein. Electronically Signed   By: Vanetta Chou M.D.   On: 05/10/2024 11:29   CT Angio Chest PE W and/or Wo Contrast Result Date: 05/10/2024 EXAM: CTA CHEST 05/10/2024 06:25:00 AM TECHNIQUE: CTA of the chest was performed without and with the administration of 75 mL of intravenous iohexol  (OMNIPAQUE ) 350 MG/ML injection. Multiplanar reformatted images are provided for review. MIP images are provided for review. Automated exposure control, iterative reconstruction, and/or weight based adjustment of the mA/kV was utilized to reduce the radiation dose to as low as reasonably achievable. COMPARISON: None available. CLINICAL HISTORY: Syncope/presyncope, cerebrovascular cause suspected. FINDINGS: PULMONARY ARTERIES: Pulmonary arteries are adequately opacified for evaluation. Filling defect within a segmental branch and subsegmental branches of the right lower lobe pulmonary artery is noted posteriorly, axial image 78/6 and axial image 76/6. Main pulmonary artery is normal in caliber. RV to LV ratio is equal to 1.1. MEDIASTINUM: Normal heart size. No pericardial effusion. There is no acute abnormality of the thoracic aorta. LYMPH NODES: No  mediastinal, hilar or axillary lymphadenopathy. LUNGS AND PLEURA: Mild emphysema with diffuse bronchial wall thickening. Dependent ground-glass attenuation identified within the posterior lung bases. Areas of atelectasis or scar noted within the imaged portions of the lung bases including both lower lobes, lingular, and right middle lobe. Calcified granuloma identified within the superior segment of the right lower lobe. No pneumothorax or consolidative change. No evidence of pleural effusion. UPPER ABDOMEN: Limited images of the upper abdomen are unremarkable. SOFT TISSUES AND BONES: No acute bone or soft tissue abnormality. IMPRESSION: 1. Pulmonary embolism involving a segmental branch and subsegmental branches of the right lower lobe pulmonary artery. 2. Emphysema. 3. Critical results were called to the ordering provider at the time of interpretation on 05/10/2024. I personally spoke with Dr. Palumbo, who acknowledged these findings. Electronically signed by: Waddell Calk MD 05/10/2024 06:40 AM EST RP Workstation: [id number]   DG Chest Portable 1 View Result Date: 05/10/2024 EXAM: 1 VIEW(S) XRAY OF THE CHEST 05/10/2024 01:44:00 AM COMPARISON: None available. CLINICAL HISTORY: painetvc FINDINGS: LUNGS AND PLEURA: Bibasilar atelectasis. No pleural effusion. No pneumothorax. HEART AND MEDIASTINUM: No acute abnormality of the cardiac and mediastinal silhouettes. BONES AND SOFT TISSUES: No acute osseous abnormality. IMPRESSION: 1. No acute cardiopulmonary abnormality. 2. Mild bibasilar atelectasis. Electronically signed by: Oneil Devonshire MD 05/10/2024 01:51 AM EST RP Workstation: [id number]      Subjective:  Ambulatory, asymptomatic, ready to go home  Discharge Exam: Vitals:   05/13/24 0415 05/13/24 0807  BP: 103/68 99/78  Pulse: 70 67  Resp: 17 15  Temp: 98.5 F (36.9 C) 98.1 F (36.7 C)  SpO2: 98% 97%     The results of significant diagnostics from this hospitalization (including imaging,  microbiology, ancillary and laboratory) are listed below for reference.     Microbiology: No results found for this or any previous visit (from the past 240 hours).   Labs: BNP (last 3 results) No results for input(s): BNP in the last 8760 hours. Basic Metabolic Panel: Recent Labs  Lab 05/10/24 0130 05/11/24 0543 05/12/24 0453  NA 141 139 137  K 3.9 4.3 4.2  CL 101 105 103  CO2 28 25 24   GLUCOSE 121* 117* 121*  BUN 18 15 21*  CREATININE 0.99 0.95 0.96  CALCIUM  9.3 8.3* 8.6*  MG  --   --  1.9   Liver Function Tests: No results for input(s): AST, ALT, ALKPHOS, BILITOT, PROT, ALBUMIN in the last 168 hours. No results for input(s): LIPASE, AMYLASE in the last 168 hours. No results for input(s): AMMONIA in the last 168 hours. CBC: Recent Labs  Lab 05/10/24 0130 05/11/24 0543 05/12/24 0453  WBC 7.6 7.0 8.2  NEUTROABS 3.5  --  4.4  HGB 14.7 15.4 15.2  HCT 43.8 45.3 44.2  MCV 90.3 91.1 90.6  PLT 227 251 243   Cardiac Enzymes: No results for input(s): CKTOTAL, CKMB, CKMBINDEX, TROPONINI in the last 168 hours. BNP: Invalid input(s): POCBNP CBG: No results for input(s): GLUCAP in the last 168 hours. D-Dimer No results for input(s): DDIMER in the last 72 hours. Hgb A1c No results for input(s): HGBA1C in the last 72 hours. Lipid Profile No results for input(s): CHOL, HDL, LDLCALC, TRIG, CHOLHDL, LDLDIRECT in the last 72 hours. Thyroid function studies Recent Labs    05/10/24 2007  TSH 2.080   Anemia work up No results for input(s): VITAMINB12, FOLATE, FERRITIN, TIBC, IRON, RETICCTPCT in the last 72 hours. Urinalysis No results found for: COLORURINE, APPEARANCEUR, LABSPEC, PHURINE, GLUCOSEU, HGBUR, BILIRUBINUR, KETONESUR, PROTEINUR, UROBILINOGEN, NITRITE, LEUKOCYTESUR Sepsis Labs Recent Labs  Lab 05/10/24 0130 05/11/24 0543 05/12/24 0453  WBC 7.6 7.0 8.2   Microbiology No  results found for this or any previous visit (from the past 240 hours).   Time coordinating discharge: 35 minutes  SIGNED:   Kamisha Ell, MD  [facility] 05/13/2024, 4:41 PM      [1] No Known Allergies

## 2024-06-22 ENCOUNTER — Telehealth: Payer: Self-pay | Admitting: Student in an Organized Health Care Education/Training Program

## 2024-06-22 NOTE — Telephone Encounter (Signed)
 Called the patient as his DPR does not list anyone that we can speak to. Patient states that he just wanted to know if he could be see sooner than his scheduled appointment with Dr. Almetta on 2/20. Advised that a message would be sent to our scheduling team but I am not sure that we will have any openings.

## 2024-07-10 ENCOUNTER — Ambulatory Visit: Admitting: Student in an Organized Health Care Education/Training Program
